# Patient Record
Sex: Female | Born: 1970 | Race: White | Hispanic: No | Marital: Married | Smoking: Never smoker
Health system: Southern US, Community
[De-identification: ages and names within clinical notes are randomized; demographics above are authoritative.]

## PROBLEM LIST (undated history)

## (undated) DIAGNOSIS — I509 Heart failure, unspecified: Secondary | ICD-10-CM

## (undated) DIAGNOSIS — R7303 Prediabetes: Secondary | ICD-10-CM

## (undated) DIAGNOSIS — E7800 Pure hypercholesterolemia, unspecified: Secondary | ICD-10-CM

## (undated) DIAGNOSIS — J302 Other seasonal allergic rhinitis: Secondary | ICD-10-CM

## (undated) DIAGNOSIS — G43909 Migraine, unspecified, not intractable, without status migrainosus: Secondary | ICD-10-CM

## (undated) DIAGNOSIS — I420 Dilated cardiomyopathy: Secondary | ICD-10-CM

## (undated) DIAGNOSIS — R011 Cardiac murmur, unspecified: Secondary | ICD-10-CM

## (undated) DIAGNOSIS — R0600 Dyspnea, unspecified: Secondary | ICD-10-CM

## (undated) HISTORY — DX: Other seasonal allergic rhinitis: J30.2

## (undated) HISTORY — DX: Migraine, unspecified, not intractable, without status migrainosus: G43.909

## (undated) HISTORY — PX: CARDIAC CATHETERIZATION: SHX172

---

## 1997-04-04 HISTORY — PX: DILATION AND CURETTAGE OF UTERUS: SHX78

## 2004-02-24 ENCOUNTER — Ambulatory Visit: Payer: Self-pay | Admitting: Obstetrics and Gynecology

## 2005-03-22 ENCOUNTER — Observation Stay: Payer: Self-pay

## 2005-06-10 ENCOUNTER — Inpatient Hospital Stay: Payer: Self-pay

## 2008-11-19 ENCOUNTER — Ambulatory Visit: Payer: Self-pay

## 2010-03-03 ENCOUNTER — Ambulatory Visit: Payer: Self-pay

## 2010-03-10 ENCOUNTER — Ambulatory Visit: Payer: Self-pay

## 2011-06-21 ENCOUNTER — Ambulatory Visit: Payer: Self-pay

## 2012-07-09 ENCOUNTER — Ambulatory Visit: Payer: Self-pay | Admitting: Obstetrics and Gynecology

## 2013-09-20 ENCOUNTER — Other Ambulatory Visit: Payer: Self-pay | Admitting: Obstetrics and Gynecology

## 2013-09-20 DIAGNOSIS — Z1231 Encounter for screening mammogram for malignant neoplasm of breast: Secondary | ICD-10-CM

## 2013-10-09 ENCOUNTER — Ambulatory Visit: Payer: Self-pay

## 2013-10-17 ENCOUNTER — Ambulatory Visit
Admission: RE | Admit: 2013-10-17 | Discharge: 2013-10-17 | Disposition: A | Discharge status: Home / Self Care | Payer: BC Managed Care – PPO | Source: Ambulatory Visit | Attending: Obstetrics and Gynecology | Admitting: Obstetrics and Gynecology

## 2013-10-17 ENCOUNTER — Encounter (INDEPENDENT_AMBULATORY_CARE_PROVIDER_SITE_OTHER): Payer: Self-pay

## 2013-10-17 DIAGNOSIS — Z1231 Encounter for screening mammogram for malignant neoplasm of breast: Secondary | ICD-10-CM

## 2014-07-11 LAB — HM PAP SMEAR: HM Pap smear: NEGATIVE

## 2014-12-25 ENCOUNTER — Other Ambulatory Visit: Payer: Self-pay | Admitting: Obstetrics and Gynecology

## 2014-12-25 ENCOUNTER — Telehealth: Payer: Self-pay | Admitting: Obstetrics and Gynecology

## 2014-12-25 DIAGNOSIS — Z1231 Encounter for screening mammogram for malignant neoplasm of breast: Secondary | ICD-10-CM

## 2014-12-25 NOTE — Telephone Encounter (Signed)
Patient called to ask if you still suggest that she have a 3D mammogram vs the normal mammo. She already has an appointment scheduled with [place]. You can reach her at [phone].Thanks

## 2014-12-26 NOTE — Telephone Encounter (Signed)
Yes I still recommend 3D- and they can now do those at [facility]- I believe it is a additional $50 charge

## 2015-01-02 ENCOUNTER — Ambulatory Visit
Admission: RE | Admit: 2015-01-02 | Discharge: 2015-01-02 | Disposition: A | Discharge status: Home / Self Care | Payer: BC Managed Care – PPO | Source: Ambulatory Visit | Attending: Obstetrics and Gynecology | Admitting: Obstetrics and Gynecology

## 2015-01-02 DIAGNOSIS — Z1231 Encounter for screening mammogram for malignant neoplasm of breast: Secondary | ICD-10-CM | POA: Insufficient documentation

## 2015-01-06 ENCOUNTER — Telehealth: Payer: Self-pay | Admitting: *Deleted

## 2015-01-06 NOTE — Telephone Encounter (Signed)
Left detailed message about mammo

## 2015-01-06 NOTE — Telephone Encounter (Signed)
-----   Message from Ulyses Amor, [place] sent at 01/06/2015  2:23 PM EDT ----- Please let her know I have reviewed her MMG and it is normal

## 2015-03-13 ENCOUNTER — Other Ambulatory Visit: Payer: Self-pay | Admitting: Obstetrics and Gynecology

## 2015-03-13 ENCOUNTER — Telehealth: Payer: Self-pay | Admitting: Obstetrics and Gynecology

## 2015-03-13 MED ORDER — HYDROCOD POLST-CPM POLST ER 10-8 MG/5ML PO SUER: 5.0000 mL | Freq: Two times a day (BID) | ORAL | Status: DC | PRN

## 2015-03-13 MED ORDER — CEFDINIR 300 MG PO CAPS: 300.0000 mg | ORAL_CAPSULE | Freq: Two times a day (BID) | ORAL | Status: DC

## 2015-03-13 NOTE — Telephone Encounter (Signed)
Pt called and she is sick, she has been running a fever off and on but is consistent on meds so that it want come back, has a really bad cough, and its started to get raspy and has had bronchitis in the past, she stated that you had called in meds for her in the past when she was sick, she wanted to be seen today and i told her we didn't have anything, so she wanted me to see if you could call something in for her. She uses [facility] [address] [place].

## 2015-03-13 NOTE — Telephone Encounter (Signed)
Please let her know I have printed a prescription she will need to run by and pick it up

## 2015-03-13 NOTE — Telephone Encounter (Signed)
UNABLE TO PICK UP THE RX WILL COME BY Monday AND GET THE TUSSINEX, CAN WE CALL IN THE OMNICEF

## 2015-07-24 ENCOUNTER — Ambulatory Visit (INDEPENDENT_AMBULATORY_CARE_PROVIDER_SITE_OTHER): Payer: BC Managed Care – PPO | Admitting: Obstetrics and Gynecology

## 2015-07-24 ENCOUNTER — Encounter: Payer: Self-pay | Admitting: Obstetrics and Gynecology

## 2015-07-24 DIAGNOSIS — E663 Overweight: Secondary | ICD-10-CM | POA: Insufficient documentation

## 2015-07-24 DIAGNOSIS — Z01419 Encounter for gynecological examination (general) (routine) without abnormal findings: Secondary | ICD-10-CM

## 2015-07-24 NOTE — Progress Notes (Signed)
Subjective:   Stacy Mercer is a 45 y.o. 843P0011 Caucasian female here for a routine well-woman exam.  Patient's last menstrual period was 07/16/2015.    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  Past Medical History Past Medical History  Diagnosis Date  . Seasonal allergies   . Migraines     Past Surgical History Past Surgical History  Procedure Laterality Date  . Dilation and curettage of uterus  1999    Gynecologic History G3P0011  Patient's last menstrual period was 07/16/2015. Contraception: IUD Last Pap: 2015. Results were: normal Last mammogram: 2016. Results were: normal   Obstetric History OB History  Gravida Para Term Preterm AB SAB TAB Ectopic Multiple Living  3    1  1   1     # Outcome Date GA Lbr Len/2nd Weight Sex Delivery Anes PTL Lv  3 TAB           2 Gravida      Vag-Spont     1 Gravida      Vag-Spont   Y      Current Medications Current Outpatient Prescriptions on File Prior to Visit  Medication Sig Dispense Refill  . cefdinir (OMNICEF) 300 MG capsule Take 1 capsule (300 mg total) by mouth 2 (two) times daily. (Patient not taking: Reported on 07/24/2015) 14 capsule 0  . chlorpheniramine-HYDROcodone (TUSSIONEX PENNKINETIC ER) 10-8 MG/5ML SUER Take 5 mLs by mouth every 12 (twelve) hours as needed for cough. (Patient not taking: Reported on 07/24/2015) 140 mL 0   No current facility-administered medications on file prior to visit.    Review of Systems Patient denies any headaches, blurred  vision, shortness of breath, chest pain, abdominal pain, problems with bowel movements, urination, or intercourse.  Objective:  BP 124/73 mmHg  Pulse 73  Ht 5\' 6"  (1.676 m)  Wt 185 lb 12.8 oz (84.278 kg)  BMI 30.00 kg/m2  LMP 07/16/2015 Physical Exam  General:  Well developed, well nourished, no acute distress. She is alert and oriented x3. Skin:  Warm and dry Neck:  Midline trachea, no thyromegaly or nodules Cardiovascular: Regular rate and rhythm, no murmur heard Lungs:  Effort normal, all lung fields clear to auscultation bilaterally Breasts:  No dominant palpable mass, retraction, or nipple discharge Abdomen:  Soft, non tender, no hepatosplenomegaly or masses Pelvic:  External genitalia is normal in appearance.  The vagina is normal in appearance. The cervix is bulbous, no CMT. IUS string noted. Thin prep pap is not done. Uterus is felt to be normal size, shape, and contour.  No adnexal masses or tenderness noted.  Extremities:  No swelling or varicosities noted Psych:  She has a normal mood and affect  Assessment:   Healthy well-woman exam H/O elevated cholesterol IUD check  Plan:  Labs obtained F/U 1 year for AE, or sooner if needed Mammogram scheduled  Alyne Martinson Suzan NailerN Dakotah Orrego, CNM

## 2015-07-24 NOTE — Patient Instructions (Signed)
  Place annual gynecologic exam patient instructions here.  Thank you for enrolling in MyChart. Please follow the instructions below to securely access your online medical record. MyChart allows you to send messages to your doctor, view your test results, manage appointments, and more.   How Do I Sign Up? 1. In your Internet browser, go to [employer] Address [employer] and enter [email or web address]. 2. Click on the Sign Up Now link in the Sign In box. You will see the New Member Sign Up page. 3. Enter your MyChart Access Code exactly as it appears below. You will not need to use this code after you've completed the sign-up process. If you do not sign up before the expiration date, you must request a new code.  MyChart Access Code: [email or web address] Expires: 09/22/2015  8:24 AM  4. Enter your Social Security Number ([id number]) and Date of Birth (mm/dd/yyyy) as indicated and click Submit. You will be taken to the next sign-up page. 5. Create a MyChart ID. This will be your MyChart login ID and cannot be changed, so think of one that is secure and easy to remember. 6. Create a MyChart password. You can change your password at any time. 7. Enter your Password Reset Question and Answer. This can be used at a later time if you forget your password.  8. Enter your e-mail address. You will receive e-mail notification when new information is available in MyChart. 9. Click Sign Up. You can now view your medical record.   Additional Information Remember, MyChart is NOT to be used for urgent needs. For medical emergencies, dial 911.

## 2015-07-25 LAB — LIPID PANEL
CHOLESTEROL TOTAL: 232 mg/dL — AB (ref 100–199)
Chol/HDL Ratio: 3.8 ratio units (ref 0.0–4.4)
HDL: 61 mg/dL (ref >39)
LDL Calculated: 153 mg/dL — ABNORMAL HIGH (ref 0–99)
Triglycerides: 91 mg/dL (ref 0–149)
VLDL CHOLESTEROL CAL: 18 mg/dL (ref 5–40)

## 2015-07-25 LAB — COMPREHENSIVE METABOLIC PANEL
ALBUMIN: 4.3 g/dL (ref 3.5–5.5)
ALK PHOS: 54 IU/L (ref 39–117)
ALT: 17 IU/L (ref 0–32)
AST: 13 IU/L (ref 0–40)
Albumin/Globulin Ratio: 2 (ref 1.2–2.2)
BILIRUBIN TOTAL: 0.5 mg/dL (ref 0.0–1.2)
BUN / CREAT RATIO: 21 (ref 9–23)
BUN: 12 mg/dL (ref 6–24)
CHLORIDE: 104 mmol/L (ref 96–106)
CO2: 22 mmol/L (ref 18–29)
Calcium: 9.4 mg/dL (ref 8.7–10.2)
Creatinine, Ser: 0.56 mg/dL — ABNORMAL LOW (ref 0.57–1.00)
GFR calc Af Amer: 129 mL/min/{1.73_m2} (ref >59)
GFR calc non Af Amer: 112 mL/min/{1.73_m2} (ref >59)
GLUCOSE: 101 mg/dL — AB (ref 65–99)
Globulin, Total: 2.1 g/dL (ref 1.5–4.5)
POTASSIUM: 3.9 mmol/L (ref 3.5–5.2)
Sodium: 143 mmol/L (ref 134–144)
Total Protein: 6.4 g/dL (ref 6.0–8.5)

## 2015-07-25 LAB — VITAMIN D 25 HYDROXY (VIT D DEFICIENCY, FRACTURES): Vit D, 25-Hydroxy: 35.8 ng/mL (ref 30.0–100.0)

## 2015-07-28 ENCOUNTER — Telehealth: Payer: Self-pay | Admitting: *Deleted

## 2015-07-28 NOTE — Telephone Encounter (Signed)
Mailed all info to pt 

## 2015-07-28 NOTE — Telephone Encounter (Signed)
-----   Message from Purcell NailsMelody N Shambley, [place] sent at 07/28/2015  3:31 PM EDT ----- Please let her know about labs, need to continue to work on weight loss, regular exercise and low carb, low cholesterol diet. Will recheck at next years physical

## 2016-07-29 ENCOUNTER — Encounter: Payer: BC Managed Care – PPO | Admitting: Obstetrics and Gynecology

## 2016-09-28 ENCOUNTER — Encounter: Payer: BC Managed Care – PPO | Admitting: Obstetrics and Gynecology

## 2016-12-16 ENCOUNTER — Encounter: Payer: BC Managed Care – PPO | Admitting: Obstetrics and Gynecology

## 2017-02-15 ENCOUNTER — Other Ambulatory Visit: Payer: Self-pay | Admitting: Obstetrics and Gynecology

## 2017-03-09 ENCOUNTER — Encounter: Payer: BC Managed Care – PPO | Admitting: Obstetrics and Gynecology

## 2017-05-11 ENCOUNTER — Encounter: Payer: BC Managed Care – PPO | Admitting: Obstetrics and Gynecology

## 2017-08-04 ENCOUNTER — Ambulatory Visit (INDEPENDENT_AMBULATORY_CARE_PROVIDER_SITE_OTHER): Payer: BC Managed Care – PPO | Admitting: Obstetrics and Gynecology

## 2017-08-04 ENCOUNTER — Encounter: Payer: Self-pay | Admitting: Obstetrics and Gynecology

## 2017-08-04 ENCOUNTER — Ambulatory Visit
Admission: RE | Admit: 2017-08-04 | Discharge: 2017-08-04 | Disposition: A | Discharge status: Home / Self Care | Payer: BC Managed Care – PPO | Source: Ambulatory Visit | Attending: Obstetrics and Gynecology | Admitting: Obstetrics and Gynecology

## 2017-08-04 VITALS — BP 122/75 | HR 79 | Ht 66.0 in | Wt 191.7 lb

## 2017-08-04 DIAGNOSIS — Z01419 Encounter for gynecological examination (general) (routine) without abnormal findings: Secondary | ICD-10-CM | POA: Diagnosis not present

## 2017-08-04 DIAGNOSIS — Z1231 Encounter for screening mammogram for malignant neoplasm of breast: Secondary | ICD-10-CM | POA: Insufficient documentation

## 2017-08-04 NOTE — Progress Notes (Signed)
Subjective:   Stacy Mercer is a 47 y.o. G54P0011 Caucasian female here for a routine well-woman exam.  No LMP recorded. (Menstrual status: IUD).    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  Past Medical History Past Medical History:  Diagnosis Date  . Migraines   . Seasonal allergies     Past Surgical History Past Surgical History:  Procedure Laterality Date  . DILATION AND CURETTAGE OF UTERUS  1999    Gynecologic History G3P0011  No LMP recorded. (Menstrual status: IUD). Contraception: IUD Last Pap: 2016. Results were: normal Last mammogram: 2016. Results were: normal   Obstetric History OB History  Gravida Para Term Preterm AB Living  SAB TAB Ectopic Multiple Live Births    1     1    # Outcome Date GA Lbr Len/2nd Weight Sex Delivery Anes PTL Lv  3 TAB           2 Gravida      Vag-Spont     1 Gravida      Vag-Spont   LIV    Current Medications Current Outpatient Medications on File Prior to Visit  Medication Sig Dispense Refill  . levonorgestrel (MIRENA) 20 MCG/24HR IUD 1 each by Intrauterine route once.     No current facility-administered medications on file prior to visit.     Review of Systems Patient denies any headaches, blurred vision, shortness of breath, chest pain, abdominal pain, problems with bowel movements, urination, or intercourse.  Objective:  BP 122/75   Pulse 79   Ht  (1.676 m)   Wt 191 lb 11.2 oz (87 kg)   BMI 30.94 kg/m  Physical Exam  General:  Well  developed, well nourished, no acute distress. She is alert and oriented x3. Skin:  Warm and dry Neck:  Midline trachea, no thyromegaly or nodules Cardiovascular: Regular rate and rhythm, no murmur heard Lungs:  Effort normal, all lung fields clear to auscultation bilaterally Breasts:  No dominant palpable mass, retraction, or nipple discharge Abdomen:  Soft, non tender, no hepatosplenomegaly or masses Pelvic:  External genitalia is normal in appearance.  The vagina is normal in appearance. The cervix is bulbous, no CMT.IUD string noted.  Thin prep pap is not done . Uterus is felt to be normal size, shape, and contour.  No adnexal masses or tenderness noted. Extremities:  No swelling or varicosities noted Psych:  She has a normal mood and affect  Assessment:   Healthy well-woman exam IUD check obesity  Plan:  Labs obtained-will follow up accordingly F/U 1 year for AE, or sooner if needed Mammogram past due-ordered  Bryndan Bilyk Suzan Nailer, CNM

## 2017-08-05 LAB — COMPREHENSIVE METABOLIC PANEL
A/G RATIO: 2.1 (ref 1.2–2.2)
ALK PHOS: 64 IU/L (ref 39–117)
ALT: 16 IU/L (ref 0–32)
AST: 12 IU/L (ref 0–40)
Albumin: 4.4 g/dL (ref 3.5–5.5)
BILIRUBIN TOTAL: 0.5 mg/dL (ref 0.0–1.2)
BUN / CREAT RATIO: 16 (ref 9–23)
BUN: 10 mg/dL (ref 6–24)
CO2: 24 mmol/L (ref 20–29)
CREATININE: 0.63 mg/dL (ref 0.57–1.00)
Calcium: 9.1 mg/dL (ref 8.7–10.2)
Chloride: 105 mmol/L (ref 96–106)
GFR calc Af Amer: 123 mL/min/{1.73_m2} (ref >59)
GFR calc non Af Amer: 106 mL/min/{1.73_m2} (ref >59)
GLOBULIN, TOTAL: 2.1 g/dL (ref 1.5–4.5)
Glucose: 101 mg/dL — ABNORMAL HIGH (ref 65–99)
POTASSIUM: 4.1 mmol/L (ref 3.5–5.2)
SODIUM: 142 mmol/L (ref 134–144)
Total Protein: 6.5 g/dL (ref 6.0–8.5)

## 2017-08-05 LAB — LIPID PANEL
CHOLESTEROL TOTAL: 258 mg/dL — AB (ref 100–199)
Chol/HDL Ratio: 4.9 ratio — ABNORMAL HIGH (ref 0.0–4.4)
HDL: 53 mg/dL (ref >39)
LDL Calculated: 182 mg/dL — ABNORMAL HIGH (ref 0–99)
TRIGLYCERIDES: 114 mg/dL (ref 0–149)
VLDL CHOLESTEROL CAL: 23 mg/dL (ref 5–40)

## 2017-08-05 LAB — HEMOGLOBIN A1C
ESTIMATED AVERAGE GLUCOSE: 123 mg/dL
HEMOGLOBIN A1C: 5.9 % — AB (ref 4.8–5.6)

## 2017-08-08 ENCOUNTER — Other Ambulatory Visit: Payer: Self-pay | Admitting: Obstetrics and Gynecology

## 2017-08-08 ENCOUNTER — Telehealth: Payer: Self-pay | Admitting: *Deleted

## 2017-08-08 DIAGNOSIS — R7303 Prediabetes: Secondary | ICD-10-CM

## 2017-08-08 DIAGNOSIS — E7800 Pure hypercholesterolemia, unspecified: Secondary | ICD-10-CM | POA: Insufficient documentation

## 2017-08-08 NOTE — Telephone Encounter (Signed)
Mailed pt all info on labs 

## 2017-08-08 NOTE — Telephone Encounter (Signed)
-----   Message from Purcell Nails, [place] sent at 08/08/2017 10:36 AM EDT ----- Please notify patient of lab results, her cholesterol is up more and she is now pre-diabetic. Please mail info on both, and tell her I want to recheck labs in 6 months, fasting. To increase exercise and work on weight loss.

## 2017-11-28 ENCOUNTER — Telehealth: Payer: Self-pay | Admitting: Obstetrics and Gynecology

## 2017-11-28 NOTE — Telephone Encounter (Signed)
Called pt she is going to come in and discuss IUD removal

## 2017-11-28 NOTE — Telephone Encounter (Signed)
The patient is stating she has had her Mirena for several years and is now experiencing regular periods.  She was told she would need to contact her provider once this happened to discuss the next steps/options, and is asking if her nurse/provider can give her a call first to discuss this prior to making an appointment.  Her best call back number is [phone], please advise, thanks.

## 2017-11-30 ENCOUNTER — Encounter: Payer: Self-pay | Admitting: Obstetrics and Gynecology

## 2017-11-30 ENCOUNTER — Ambulatory Visit: Payer: BC Managed Care – PPO | Admitting: Obstetrics and Gynecology

## 2017-11-30 VITALS — BP 131/81 | HR 77 | Ht 66.0 in | Wt 183.5 lb

## 2017-11-30 DIAGNOSIS — Z975 Presence of (intrauterine) contraceptive device: Secondary | ICD-10-CM

## 2017-11-30 DIAGNOSIS — N921 Excessive and frequent menstruation with irregular cycle: Secondary | ICD-10-CM | POA: Diagnosis not present

## 2017-11-30 NOTE — Progress Notes (Signed)
  Subjective:     Patient ID: Stacy Mercer, female   DOB: 06-Nov-1970, 47 y.o.   MRN: [id number]  HPI IUD has been in for 6 years and menses have returned since June, occurring every 24d.  Reports onset menarche age 47.   Review of Systems  Constitutional: Negative.   HENT: Negative.   Eyes: Negative.   Respiratory: Negative.   Cardiovascular: Negative.   Gastrointestinal: Negative.   Endocrine: Negative.   Genitourinary: Positive for menstrual problem.       Objective:   Physical Exam A&Ox4 Well groomed female in no distress Vitals:   11/30/17 0850  Weight: 183 lb 8 oz (83.2 kg)  Height: 5\' 6"  (1.676 m)   Pelvic deferred Labs obtained.     Assessment:     BTB with IUD    Plan:     Labs obtained to try and determine phase of menopause, if close will keep IUD one more year, if not will put a new one in.   Melody [place], CNM

## 2017-12-01 LAB — FSH/LH
FSH: 15.1 m[IU]/mL
LH: 8.8 m[IU]/mL

## 2017-12-01 LAB — THYROID PANEL WITH TSH
Free Thyroxine Index: 1.8 (ref 1.2–4.9)
T3 Uptake Ratio: 23 % — ABNORMAL LOW (ref 24–39)
T4, Total: 7.7 ug/dL (ref 4.5–12.0)
TSH: 2.19 u[IU]/mL (ref 0.450–4.500)

## 2017-12-01 LAB — PROGESTERONE: Progesterone: 1.9 ng/mL

## 2017-12-01 LAB — ESTRADIOL: ESTRADIOL: 20.9 pg/mL

## 2017-12-26 ENCOUNTER — Telehealth: Payer: Self-pay | Admitting: Obstetrics and Gynecology

## 2017-12-26 NOTE — Telephone Encounter (Signed)
The patient called and state that she need to speak with Amy or Melody in regards to her not having earl;y menopause and needing to come in for IUD removal and reinsertion. Please advise.

## 2017-12-27 NOTE — Telephone Encounter (Signed)
Hey Stacy Mercer could you please make her appt for this, thanks

## 2018-01-04 ENCOUNTER — Other Ambulatory Visit: Payer: Self-pay | Admitting: Obstetrics and Gynecology

## 2018-01-04 ENCOUNTER — Encounter: Payer: Self-pay | Admitting: Obstetrics and Gynecology

## 2018-01-04 ENCOUNTER — Encounter: Payer: BC Managed Care – PPO | Admitting: Obstetrics and Gynecology

## 2018-01-04 ENCOUNTER — Telehealth: Payer: Self-pay | Admitting: Obstetrics and Gynecology

## 2018-01-04 ENCOUNTER — Ambulatory Visit (INDEPENDENT_AMBULATORY_CARE_PROVIDER_SITE_OTHER): Payer: BC Managed Care – PPO | Admitting: Obstetrics and Gynecology

## 2018-01-04 VITALS — BP 128/88 | HR 97 | Ht 66.0 in | Wt 189.2 lb

## 2018-01-04 DIAGNOSIS — Z30433 Encounter for removal and reinsertion of intrauterine contraceptive device: Secondary | ICD-10-CM | POA: Diagnosis not present

## 2018-01-04 DIAGNOSIS — N841 Polyp of cervix uteri: Secondary | ICD-10-CM

## 2018-01-04 NOTE — Telephone Encounter (Signed)
Spoke with pt we discussed her procedure, she voiced understanding

## 2018-01-04 NOTE — Progress Notes (Signed)
Stacy Mercer is a 47 y.o. year old G4P0011 Caucasian female who presents for removal and replacement of a Mirena IUD. She was given informed consent for removal and reinsertion of her Mirena. Her Mirena was placed 2013, No LMP recorded. (Menstrual status: IUD)., and her pregnancy test today was negative.   The risks and benefits of the method and placement have been thouroughly reviewed with the patient and all questions were answered.  Specifically the patient is aware of failure rate of 04/998, expulsion of the IUD and of possible perforation.  The patient is aware of irregular bleeding due to the method and understands the incidence of irregular bleeding diminishes with time.  Signed copy of informed consent in chart.   No LMP recorded. (Menstrual status: IUD). BP 128/88   Pulse 97   Ht 5\' 6"  (1.676 m)   Wt 189 lb 3.2 oz (85.8 kg)   BMI 30.54 kg/m  No results found for this or any previous visit (from the past 24 hour(s)).   Appropriate time out taken. A graves speculum was placed in the vagina.  The cervix was visualized, prepped using Betadine.4mm polyp noted extended from endocervical os and was grasped easily with sponge clamp and removed. and  The strings were visible. They were grasped and the Mirena was easily removed. The cervix was then grasped with a single-tooth tenaculum. The uterus was found to be neutral and it sounded to 9 cm.  Mirena IUD placed per manufacturer's recommendations without complications. The strings were trimmed to 3 cm.  The patient tolerated the procedure well.   The patient was given post procedure instructions, including signs and symptoms of infection and to check for the strings after each menses or each month, and refraining from intercourse or anything in the vagina for 3 days. Cervical polyp sent for pathology and will notify via MyChart of results.  She was given a Mirena care card with date Mirena placed, and date Mirena to be removed.    Nicolae Vasek Suzan Nailer, CNM

## 2018-01-04 NOTE — Telephone Encounter (Signed)
The patient called and stated that she would like a call back from Amy in regards to her having a tablespoon amount of blood after today's visit. Please advise.

## 2018-01-10 LAB — PATHOLOGY

## 2018-07-06 ENCOUNTER — Telehealth: Payer: Self-pay

## 2018-07-06 NOTE — Telephone Encounter (Signed)
Called pt to sign up for mychart. Pt requested the link to be sent to her email address. Address was reviewed and correct. Link sent to pt.

## 2018-08-09 ENCOUNTER — Encounter: Payer: BC Managed Care – PPO | Admitting: Obstetrics and Gynecology

## 2018-10-19 ENCOUNTER — Ambulatory Visit (INDEPENDENT_AMBULATORY_CARE_PROVIDER_SITE_OTHER): Payer: BC Managed Care – PPO | Admitting: Obstetrics and Gynecology

## 2018-10-19 ENCOUNTER — Other Ambulatory Visit: Payer: Self-pay

## 2018-10-19 ENCOUNTER — Encounter: Payer: Self-pay | Admitting: Obstetrics and Gynecology

## 2018-10-19 ENCOUNTER — Other Ambulatory Visit (HOSPITAL_COMMUNITY)
Admission: RE | Admit: 2018-10-19 | Discharge: 2018-10-19 | Disposition: A | Discharge status: Home / Self Care | Payer: BC Managed Care – PPO | Source: Ambulatory Visit | Attending: Obstetrics and Gynecology | Admitting: Obstetrics and Gynecology

## 2018-10-19 VITALS — BP 141/76 | HR 76 | Ht 66.0 in | Wt 191.8 lb

## 2018-10-19 DIAGNOSIS — Z01419 Encounter for gynecological examination (general) (routine) without abnormal findings: Secondary | ICD-10-CM | POA: Insufficient documentation

## 2018-10-19 DIAGNOSIS — E7800 Pure hypercholesterolemia, unspecified: Secondary | ICD-10-CM | POA: Diagnosis not present

## 2018-10-19 DIAGNOSIS — R7303 Prediabetes: Secondary | ICD-10-CM

## 2018-10-19 NOTE — Progress Notes (Signed)
Subjective:   Stacy Mercer is a 48 y.o. G67P0011 Caucasian female here for a routine well-woman exam.  No LMP recorded. (Menstrual status: IUD).    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  Past Medical History Past Medical History:  Diagnosis Date  . Migraines   . Seasonal allergies     Past Surgical History Past Surgical History:  Procedure Laterality Date  . DILATION AND CURETTAGE OF UTERUS  1999    Gynecologic History G3P0011  No LMP recorded. (Menstrual status: IUD). Contraception: IUD Last Pap: 2016. Results were: normal Last mammogram: 08/2017. Results were: normal   Obstetric History OB History  Gravida Para Term Preterm AB Living  3       1 1   SAB TAB Ectopic Multiple Live Births    1     1    # Outcome Date GA Lbr Len/2nd Weight Sex Delivery Anes PTL Lv  3 TAB           2 Gravida      Vag-Spont     1 Gravida      Vag-Spont   LIV    Current Medications Current Outpatient Medications on File Prior to Visit  Medication Sig Dispense Refill  . levonorgestrel (MIRENA) 20 MCG/24HR IUD 1 each by Intrauterine route once.     No current facility-administered medications on file prior to visit.     Review of Systems Patient denies any headaches, blurred vision, shortness of breath, chest pain, abdominal pain, problems with bowel movements, urination, or intercourse.  Objective:  BP (!) 141/76   Pulse 76   Ht 5\' 6"  (1.676 m)   Wt 191 lb 12.8 oz (87 kg)   BMI 30.96 kg/m  Physical Exam  General:  Well developed,  well nourished, no acute distress. She is alert and oriented x3. Skin:  Warm and dry Neck:  Midline trachea, no thyromegaly or nodules Cardiovascular: Regular rate and rhythm, no murmur heard Lungs:  Effort normal, all lung fields clear to auscultation bilaterally Breasts:  No dominant palpable mass, retraction, or nipple discharge Abdomen:  Soft, non tender, no hepatosplenomegaly or masses Pelvic:  External genitalia is normal in appearance.  The vagina is normal in appearance. The cervix is bulbous, no CMT.  Thin prep pap is done with HR HPV cotesting. Uterus is felt to be normal size, shape, and contour.  No adnexal masses or tenderness noted.IUD string noted Extremities:  No swelling or varicosities noted Psych:  She has a normal mood and affect  Assessment:   Healthy well-woman exam IUD check Pre-diabetes Elevated cholesterol BMI 30  Plan:  Labs obtained-will follow up accordingly F/U 1 year for AE, or sooner if needed Mammogram ordered Colonoscopy declined will do Cologard screening though, order placed.  Naria Abbey Rockney Ghee, CNM

## 2018-10-20 LAB — LIPID PANEL
Chol/HDL Ratio: 4.6 ratio — ABNORMAL HIGH (ref 0.0–4.4)
Cholesterol, Total: 249 mg/dL — ABNORMAL HIGH (ref 100–199)
HDL: 54 mg/dL (ref >39)
LDL Calculated: 172 mg/dL — ABNORMAL HIGH (ref 0–99)
Triglycerides: 114 mg/dL (ref 0–149)
VLDL Cholesterol Cal: 23 mg/dL (ref 5–40)

## 2018-10-20 LAB — COMPREHENSIVE METABOLIC PANEL
ALT: 16 IU/L (ref 0–32)
AST: 15 IU/L (ref 0–40)
Albumin/Globulin Ratio: 2.3 — ABNORMAL HIGH (ref 1.2–2.2)
Albumin: 4.4 g/dL (ref 3.8–4.8)
Alkaline Phosphatase: 61 IU/L (ref 39–117)
BUN/Creatinine Ratio: 19 (ref 9–23)
BUN: 11 mg/dL (ref 6–24)
Bilirubin Total: 0.6 mg/dL (ref 0.0–1.2)
CO2: 21 mmol/L (ref 20–29)
Calcium: 8.9 mg/dL (ref 8.7–10.2)
Chloride: 103 mmol/L (ref 96–106)
Creatinine, Ser: 0.59 mg/dL (ref 0.57–1.00)
GFR calc Af Amer: 124 mL/min/{1.73_m2} (ref >59)
GFR calc non Af Amer: 107 mL/min/{1.73_m2} (ref >59)
Globulin, Total: 1.9 g/dL (ref 1.5–4.5)
Glucose: 103 mg/dL — ABNORMAL HIGH (ref 65–99)
Potassium: 3.9 mmol/L (ref 3.5–5.2)
Sodium: 139 mmol/L (ref 134–144)
Total Protein: 6.3 g/dL (ref 6.0–8.5)

## 2018-10-20 LAB — HEMOGLOBIN A1C
Est. average glucose Bld gHb Est-mCnc: 120 mg/dL
Hgb A1c MFr Bld: 5.8 % — ABNORMAL HIGH (ref 4.8–5.6)

## 2018-10-20 LAB — FSH/LH
FSH: 5.3 m[IU]/mL
LH: 6.2 m[IU]/mL

## 2018-10-20 LAB — TSH: TSH: 3.13 u[IU]/mL (ref 0.450–4.500)

## 2018-10-24 LAB — CYTOLOGY - PAP
Diagnosis: NEGATIVE
HPV: NOT DETECTED

## 2019-01-15 ENCOUNTER — Telehealth: Payer: Self-pay | Admitting: Obstetrics and Gynecology

## 2019-01-15 NOTE — Telephone Encounter (Signed)
LM for patient to return call.

## 2019-01-15 NOTE — Telephone Encounter (Signed)
The patient called and stated that she was seen back in June and the patient stated that her provider informed her that a colonoscopy/stool test kit would be mailed to her. Pt stated that she has not received it yet and is wanting to know what time frame should she expect to receive that. Please advise.

## 2019-01-21 NOTE — Telephone Encounter (Signed)
LM for patient to return call.

## 2019-02-12 ENCOUNTER — Telehealth: Payer: Self-pay | Admitting: Obstetrics and Gynecology

## 2019-02-12 NOTE — Telephone Encounter (Signed)
Cologuard order requisition form faxed to [employer] and confirmation received.

## 2019-02-12 NOTE — Telephone Encounter (Signed)
Pt called and stated that a stool kit was to be sent to her and that she never got it in the mail. The patient is requesting a call back from a nurse. Please advise.

## 2019-02-12 NOTE — Telephone Encounter (Signed)
Attempted to contact patient, no answer.  LMTRC. 

## 2019-02-13 NOTE — Telephone Encounter (Signed)
Called and spoke with patient.  Aware that order request was faxed to Colog[facility].  Advised if she does not receive within 2 weeks to call back, patient verbalized understanding.

## 2019-02-14 ENCOUNTER — Ambulatory Visit
Admission: RE | Admit: 2019-02-14 | Discharge: 2019-02-14 | Disposition: A | Discharge status: Home / Self Care | Payer: BC Managed Care – PPO | Source: Ambulatory Visit | Attending: Obstetrics and Gynecology | Admitting: Obstetrics and Gynecology

## 2019-02-14 ENCOUNTER — Encounter (INDEPENDENT_AMBULATORY_CARE_PROVIDER_SITE_OTHER): Payer: Self-pay

## 2019-02-14 ENCOUNTER — Other Ambulatory Visit: Payer: Self-pay

## 2019-02-14 DIAGNOSIS — Z01419 Encounter for gynecological examination (general) (routine) without abnormal findings: Secondary | ICD-10-CM | POA: Diagnosis present

## 2019-02-14 DIAGNOSIS — Z1231 Encounter for screening mammogram for malignant neoplasm of breast: Secondary | ICD-10-CM | POA: Diagnosis not present

## 2019-05-07 LAB — COLOGUARD: COLOGUARD: NEGATIVE

## 2019-05-07 LAB — EXTERNAL GENERIC LAB PROCEDURE: COLOGUARD: NEGATIVE

## 2019-10-22 ENCOUNTER — Encounter: Payer: BC Managed Care – PPO | Admitting: Obstetrics and Gynecology

## 2020-06-10 ENCOUNTER — Telehealth: Payer: Self-pay | Admitting: Obstetrics and Gynecology

## 2020-06-10 NOTE — Telephone Encounter (Signed)
Patient called about results form colon test (by mail) saw melody on 10-2019; states that she never heard anything form office about results. Pt is debating scheduling physical with [place] or establishing care elsewhere.

## 2020-06-10 NOTE — Telephone Encounter (Signed)
LMTRC

## 2020-06-12 NOTE — Telephone Encounter (Signed)
Pt aware cologard neg.   Encouraged to make AE appt. Pt wants to think about it.

## 2021-04-28 ENCOUNTER — Other Ambulatory Visit: Payer: Self-pay | Admitting: Certified Nurse Midwife

## 2021-04-28 DIAGNOSIS — Z1231 Encounter for screening mammogram for malignant neoplasm of breast: Secondary | ICD-10-CM

## 2021-06-15 ENCOUNTER — Ambulatory Visit
Admission: RE | Admit: 2021-06-15 | Discharge: 2021-06-15 | Disposition: A | Discharge status: Home / Self Care | Payer: BC Managed Care – PPO | Source: Ambulatory Visit | Attending: Certified Nurse Midwife | Admitting: Certified Nurse Midwife

## 2021-06-15 ENCOUNTER — Other Ambulatory Visit: Payer: Self-pay

## 2021-06-15 DIAGNOSIS — Z1231 Encounter for screening mammogram for malignant neoplasm of breast: Secondary | ICD-10-CM | POA: Diagnosis present

## 2022-04-21 DIAGNOSIS — R030 Elevated blood-pressure reading, without diagnosis of hypertension: Secondary | ICD-10-CM | POA: Insufficient documentation

## 2022-04-30 IMAGING — MG MM DIGITAL SCREENING BILAT W/ TOMO AND CAD
8 series · 8 of 24 positions shown · non-contrast
Comparison: Previous exam(s).

CLINICAL DATA: Screening.

EXAM:
DIGITAL SCREENING BILATERAL MAMMOGRAM WITH TOMOSYNTHESIS AND CAD
TECHNIQUE: Bilateral screening digital craniocaudal and mediolateral oblique
mammograms were obtained. Bilateral screening digital breast
tomosynthesis was performed. The images were evaluated with
computer-aided detection.

[L CC synth-2D]
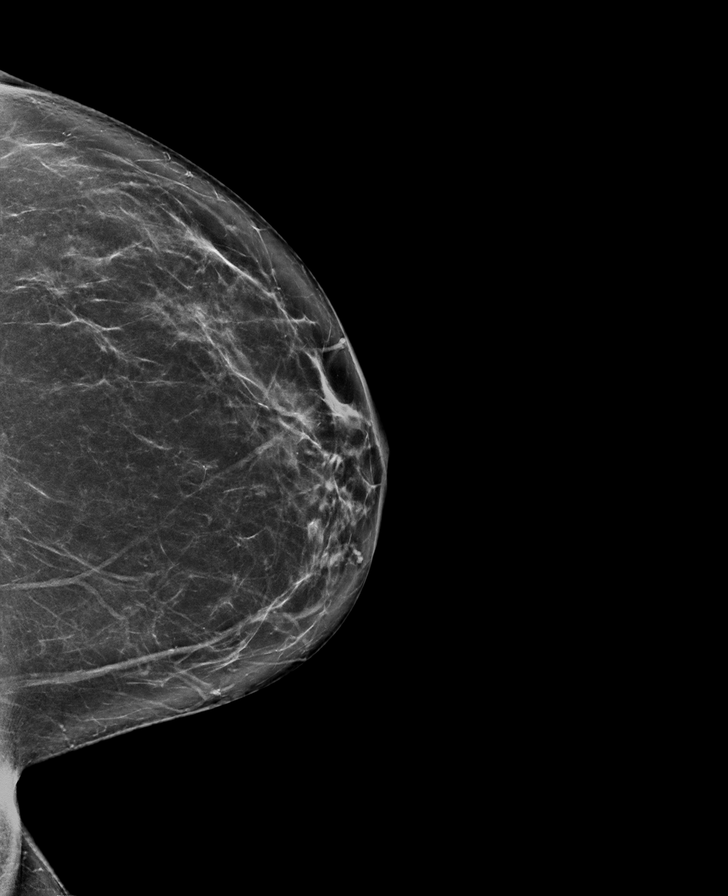

[R MLO synth-2D]
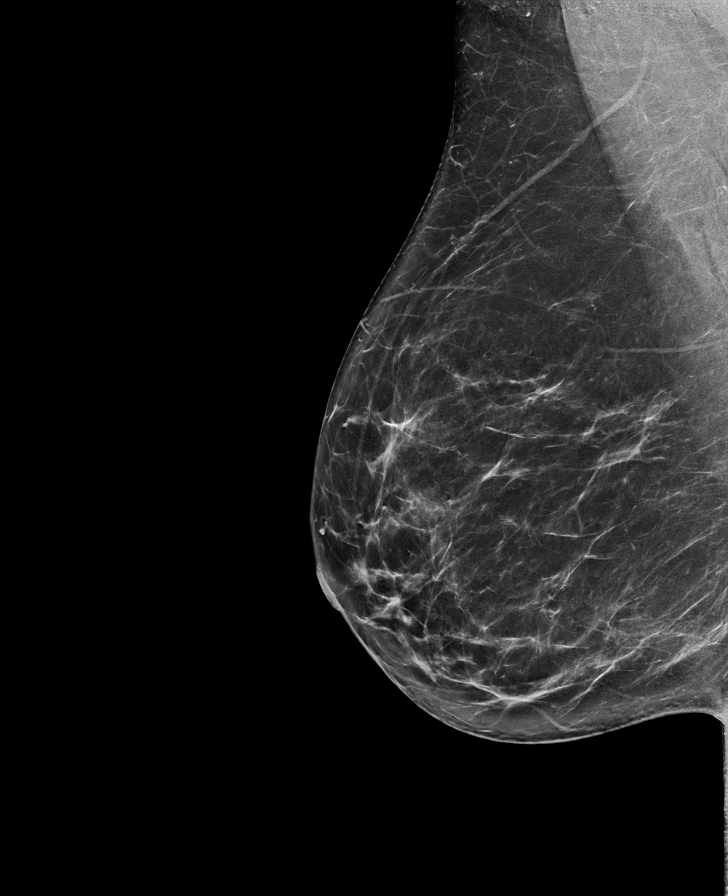

[R CC synth-2D]
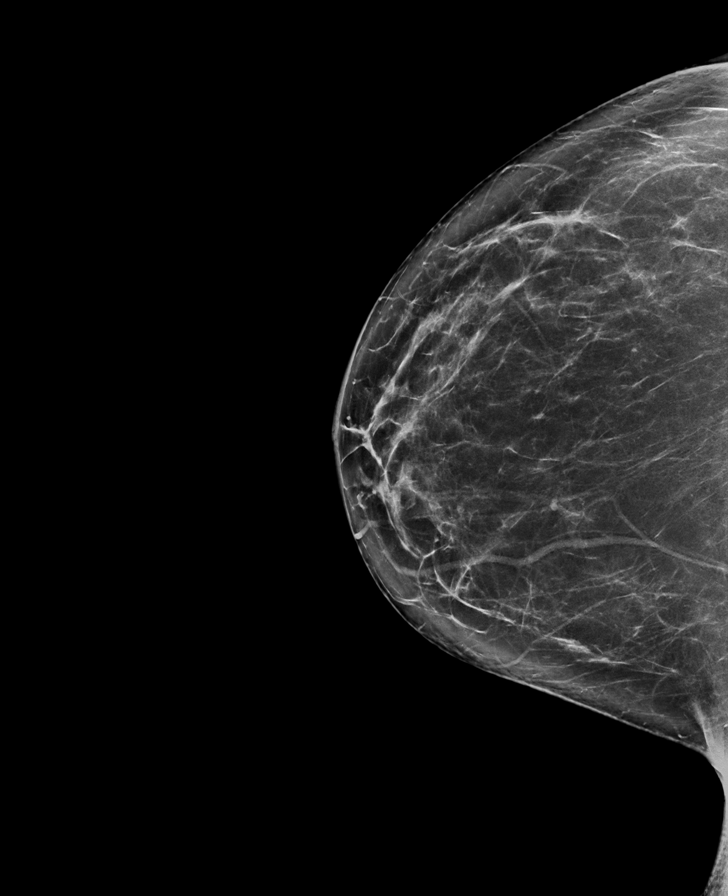

[L MLO synth-2D]
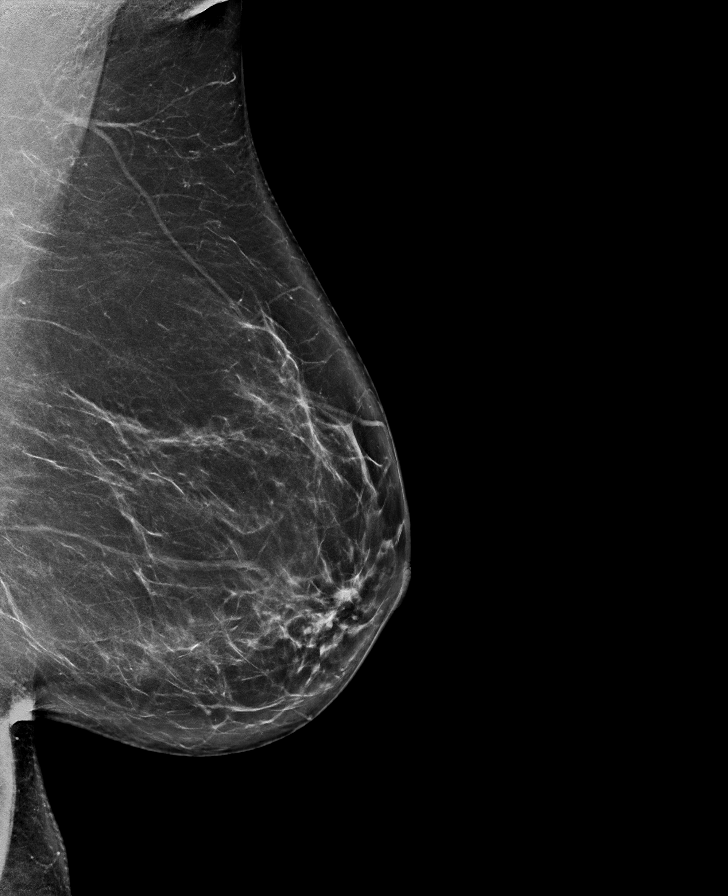

[R MLO tomo · tomo slice 43/85.0]
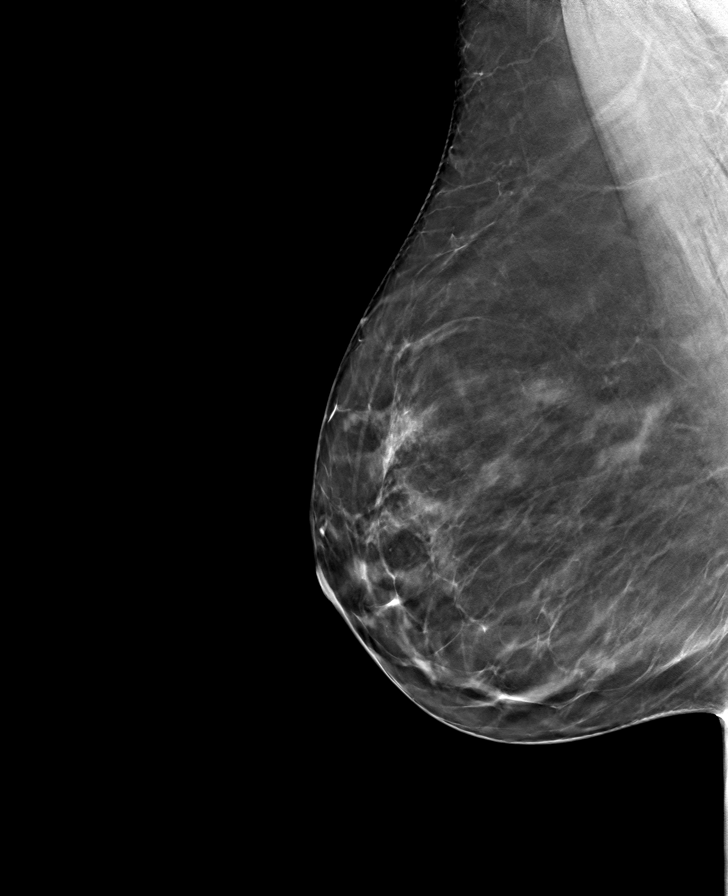

[L MLO tomo · tomo slice 45/89.0]
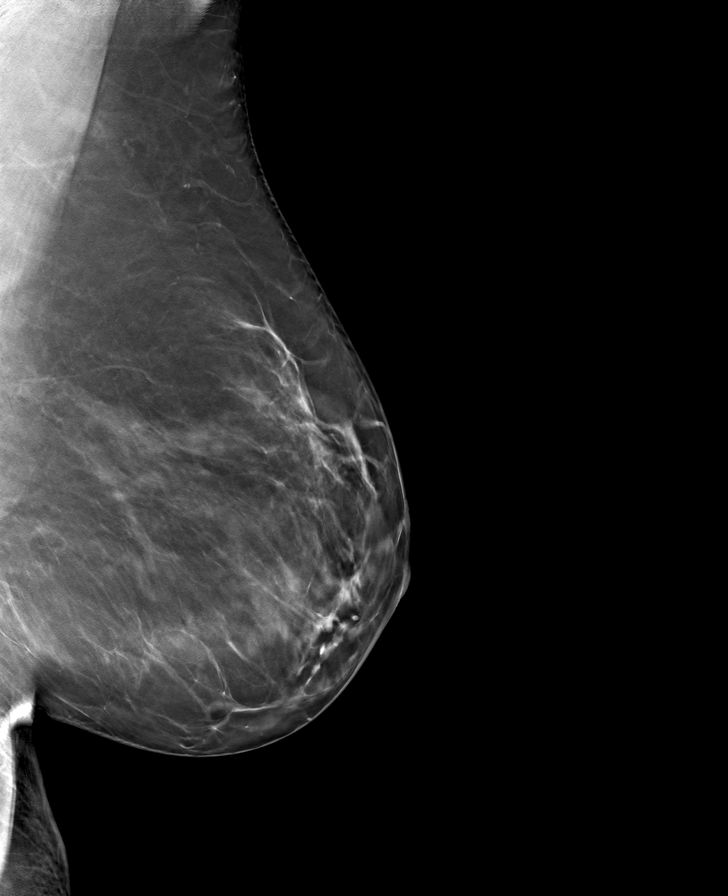

[R CC tomo · tomo slice 42/83.0]
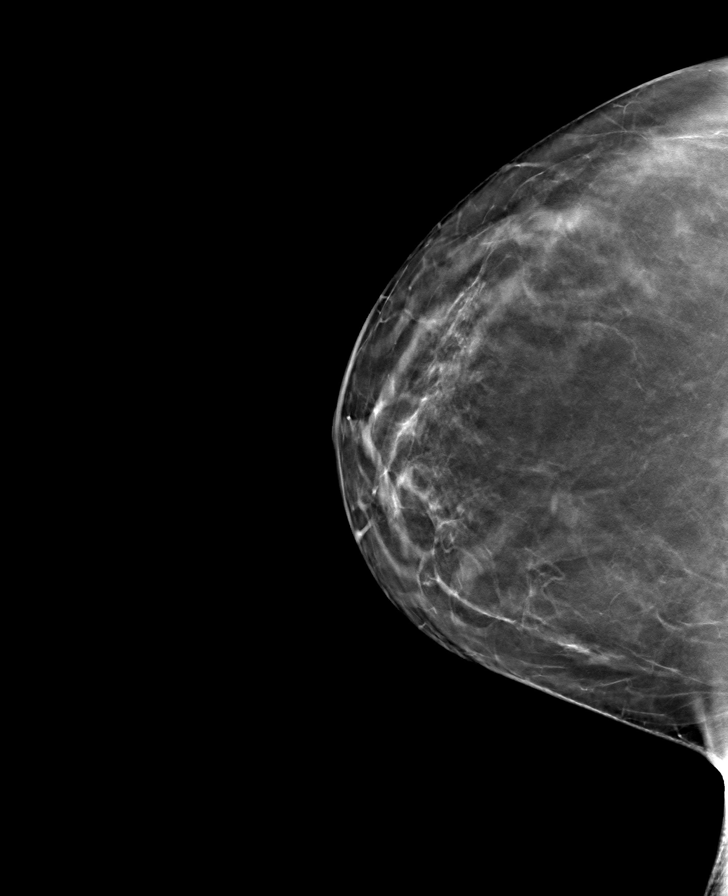

[L CC tomo · tomo slice 41/81.0]
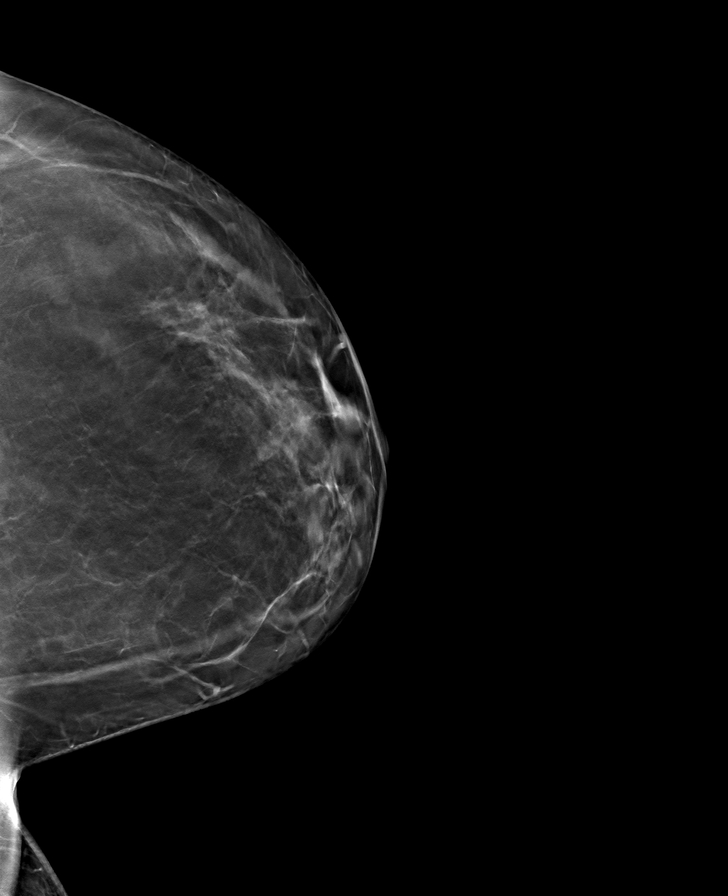

[8 of 24 positions shown; findings below may reference images not displayed]

ACR Breast Density Category c: The breast tissue is heterogeneously
dense, which may obscure small masses.
FINDINGS: There are no findings suspicious for malignancy.
IMPRESSION: No mammographic evidence of malignancy. A result letter of this
screening mammogram will be mailed directly to the patient.

RECOMMENDATION:
Screening mammogram in one year. (Code:Q3-W-BC3)

BI-RADS CATEGORY  1: Negative.

## 2022-05-03 DIAGNOSIS — I420 Dilated cardiomyopathy: Secondary | ICD-10-CM | POA: Insufficient documentation

## 2022-06-06 ENCOUNTER — Other Ambulatory Visit: Payer: Self-pay | Admitting: Obstetrics and Gynecology

## 2022-06-06 DIAGNOSIS — Z1231 Encounter for screening mammogram for malignant neoplasm of breast: Secondary | ICD-10-CM

## 2022-06-07 ENCOUNTER — Ambulatory Visit
Admission: RE | Admit: 2022-06-07 | Discharge: 2022-06-07 | Disposition: A | Discharge status: Home / Self Care | Payer: BC Managed Care – PPO | Attending: Cardiology | Admitting: Cardiology

## 2022-06-07 ENCOUNTER — Encounter: Payer: Self-pay | Admitting: Cardiology

## 2022-06-07 ENCOUNTER — Encounter
Admission: RE | Disposition: A | Discharge status: Home / Self Care | Payer: Self-pay | Source: Home / Self Care | Attending: Cardiology

## 2022-06-07 DIAGNOSIS — I351 Nonrheumatic aortic (valve) insufficiency: Secondary | ICD-10-CM | POA: Insufficient documentation

## 2022-06-07 DIAGNOSIS — R079 Chest pain, unspecified: Secondary | ICD-10-CM | POA: Diagnosis present

## 2022-06-07 DIAGNOSIS — Z01818 Encounter for other preprocedural examination: Secondary | ICD-10-CM

## 2022-06-07 HISTORY — PX: LEFT HEART CATH AND CORONARY ANGIOGRAPHY: CATH118249

## 2022-06-07 SURGERY — LEFT HEART CATH AND CORONARY ANGIOGRAPHY
Anesthesia: Moderate Sedation | Laterality: Left

## 2022-06-07 MED ORDER — ASPIRIN 81 MG PO CHEW
CHEWABLE_TABLET | ORAL | Status: AC
  Filled 2022-06-07: qty 1

## 2022-06-07 MED ORDER — SODIUM CHLORIDE 0.9% FLUSH: 3.0000 mL | INTRAVENOUS | Status: DC | PRN

## 2022-06-07 MED ORDER — LABETALOL HCL 5 MG/ML IV SOLN: 10.0000 mg | INTRAVENOUS | Status: DC | PRN

## 2022-06-07 MED ORDER — LIDOCAINE HCL (PF) 1 % IJ SOLN
INTRAMUSCULAR | Status: DC | PRN
  Administered 2022-06-07: 2 mL

## 2022-06-07 MED ORDER — SODIUM CHLORIDE 0.9% FLUSH: 3.0000 mL | Freq: Two times a day (BID) | INTRAVENOUS | Status: DC

## 2022-06-07 MED ORDER — ACETAMINOPHEN 325 MG PO TABS: 650.0000 mg | ORAL_TABLET | ORAL | Status: DC | PRN

## 2022-06-07 MED ORDER — FENTANYL CITRATE (PF) 100 MCG/2ML IJ SOLN
INTRAMUSCULAR | Status: AC
  Filled 2022-06-07: qty 2

## 2022-06-07 MED ORDER — VERAPAMIL HCL 2.5 MG/ML IV SOLN
INTRAVENOUS | Status: AC
  Filled 2022-06-07: qty 2

## 2022-06-07 MED ORDER — SODIUM CHLORIDE 0.9 % IV SOLN: 250.0000 mL | INTRAVENOUS | Status: DC | PRN

## 2022-06-07 MED ORDER — HEPARIN SODIUM (PORCINE) 1000 UNIT/ML IJ SOLN
INTRAMUSCULAR | Status: AC
  Filled 2022-06-07: qty 10

## 2022-06-07 MED ORDER — HYDRALAZINE HCL 20 MG/ML IJ SOLN: 10.0000 mg | INTRAMUSCULAR | Status: DC | PRN

## 2022-06-07 MED ORDER — ASPIRIN 81 MG PO CHEW
81.0000 mg | CHEWABLE_TABLET | ORAL | Status: AC
  Administered 2022-06-07: 81 mg via ORAL

## 2022-06-07 MED ORDER — SODIUM CHLORIDE 0.9 % WEIGHT BASED INFUSION
3.0000 mL/kg/h | INTRAVENOUS | Status: AC
  Administered 2022-06-07: 3 mL/kg/h via INTRAVENOUS

## 2022-06-07 MED ORDER — SODIUM CHLORIDE 0.9 % WEIGHT BASED INFUSION: 1.0000 mL/kg/h | INTRAVENOUS | Status: DC

## 2022-06-07 MED ORDER — HEPARIN (PORCINE) IN NACL 1000-0.9 UT/500ML-% IV SOLN
INTRAVENOUS | Status: AC
  Filled 2022-06-07: qty 1000

## 2022-06-07 MED ORDER — HEPARIN (PORCINE) IN NACL 2000-0.9 UNIT/L-% IV SOLN
INTRAVENOUS | Status: DC | PRN
  Administered 2022-06-07: 1000 mL

## 2022-06-07 MED ORDER — ONDANSETRON HCL 4 MG/2ML IJ SOLN: 4.0000 mg | Freq: Four times a day (QID) | INTRAMUSCULAR | Status: DC | PRN

## 2022-06-07 MED ORDER — FENTANYL CITRATE (PF) 100 MCG/2ML IJ SOLN
INTRAMUSCULAR | Status: DC | PRN
  Administered 2022-06-07: 25 ug via INTRAVENOUS

## 2022-06-07 MED ORDER — IOHEXOL 300 MG/ML  SOLN
INTRAMUSCULAR | Status: DC | PRN
  Administered 2022-06-07: 95 mL

## 2022-06-07 MED ORDER — HEPARIN SODIUM (PORCINE) 1000 UNIT/ML IJ SOLN
INTRAMUSCULAR | Status: DC | PRN
  Administered 2022-06-07: 4000 [IU] via INTRAVENOUS

## 2022-06-07 MED ORDER — VERAPAMIL HCL 2.5 MG/ML IV SOLN
INTRAVENOUS | Status: DC | PRN
  Administered 2022-06-07 (×2): 2.5 mg via INTRAVENOUS

## 2022-06-07 MED ORDER — MIDAZOLAM HCL 2 MG/2ML IJ SOLN
INTRAMUSCULAR | Status: AC
  Filled 2022-06-07: qty 2

## 2022-06-07 MED ORDER — MIDAZOLAM HCL 2 MG/2ML IJ SOLN
INTRAMUSCULAR | Status: DC | PRN
  Administered 2022-06-07: 1 mg via INTRAVENOUS

## 2022-06-07 SURGICAL SUPPLY — 10 items
CATH 5FR JL3.5 JR4 ANG PIG MP (CATHETERS) IMPLANT
DEVICE RAD TR BAND REGULAR (VASCULAR PRODUCTS) IMPLANT
DRAPE BRACHIAL (DRAPES) IMPLANT
GLIDESHEATH SLEND SS 6F .021 (SHEATH) IMPLANT
GUIDEWIRE INQWIRE 1.5J.035X260 (WIRE) IMPLANT
INQWIRE 1.5J .035X260CM (WIRE) ×1
PACK CARDIAC CATH (CUSTOM PROCEDURE TRAY) ×1 IMPLANT
PROTECTION STATION PRESSURIZED (MISCELLANEOUS) ×1
SET ATX-X65L (MISCELLANEOUS) IMPLANT
STATION PROTECTION PRESSURIZED (MISCELLANEOUS) IMPLANT

## 2022-06-08 ENCOUNTER — Encounter: Payer: Self-pay | Admitting: Cardiology

## 2022-09-14 ENCOUNTER — Ambulatory Visit
Admission: RE | Admit: 2022-09-14 | Discharge: 2022-09-14 | Disposition: A | Discharge status: Home / Self Care | Payer: BC Managed Care – PPO | Source: Ambulatory Visit | Attending: Obstetrics and Gynecology | Admitting: Obstetrics and Gynecology

## 2022-09-14 DIAGNOSIS — Z1231 Encounter for screening mammogram for malignant neoplasm of breast: Secondary | ICD-10-CM | POA: Insufficient documentation

## 2023-02-08 ENCOUNTER — Encounter: Payer: Self-pay | Admitting: Gastroenterology

## 2023-02-23 ENCOUNTER — Encounter: Payer: Self-pay | Admitting: Gastroenterology

## 2023-02-23 NOTE — H&P (Signed)
Pre-Procedure H&P   Patient ID: Stacy Mercer is a 52 y.o. female.  Gastroenterology Provider: Jaynie Collins, DO  Referring Provider: Fransico Setters, NP PCP: Christeen Douglas, MD  Date: 02/24/2023  HPI Ms. Stacy Mercer is a 52 y.o. female who presents today for Colonoscopy for screening colonoscopy, family history colon polyps.  Patient reports regular bowel movements without melena or hematochezia.  Her mother had colon polyps.  Potentially her paternal grandfather had colorectal cancer  Underwent echocardiogram in January 2024 demonstrating aortic insufficiency and ejection fraction of 40%. Denies chest pain and shortness of breath  Hemoglobin 13 MCV 89 platelets 213,000 creatinine 0.5   Past Medical History:  Diagnosis Date   CHF (congestive heart failure) (HCC)    Dilated cardiomyopathy (HCC)    Dyspnea    Elevated cholesterol    Heart murmur    Migraines    Pre-diabetes    Seasonal allergies     Past Surgical History:  Procedure Laterality Date   CARDIAC CATHETERIZATION     DILATION AND CURETTAGE OF UTERUS  04/04/1997   LEFT HEART CATH AND CORONARY ANGIOGRAPHY Left 06/07/2022   Procedure: LEFT HEART CATH AND CORONARY ANGIOGRAPHY;  Surgeon: Marcina Millard, MD;  Location: [facility] INVASIVE CV LAB;  Service: Cardiovascular;  Laterality: Left;    Family History Mother-colon polyps Maternal grandfather potential colorectal cancer No h/o GI disease or malignancy  Review of Systems  Constitutional:  Negative for activity change, appetite change, chills, diaphoresis, fatigue, fever and unexpected weight change.  HENT:  Negative for trouble swallowing and voice change.   Respiratory:  Negative for shortness of breath and wheezing.   Cardiovascular:  Negative for chest pain, palpitations and leg swelling.  Gastrointestinal:  Negative for abdominal distention, abdominal pain, anal bleeding, blood in stool, constipation, diarrhea, nausea, rectal pain and  vomiting.  Musculoskeletal:  Negative for arthralgias and myalgias.  Skin:  Negative for color change and pallor.  Neurological:  Negative for dizziness, syncope and weakness.  Psychiatric/Behavioral:  Negative for confusion.   All other systems reviewed and are negative.    Medications No current facility-administered medications on file prior to encounter.   Current Outpatient Medications on File Prior to Encounter  Medication Sig Dispense Refill   ivermectin (STROMECTOL) 3 MG TABS tablet Take 150 mcg/kg by mouth once.     levonorgestrel (MIRENA) 20 MCG/24HR IUD 1 each by Intrauterine route once.     losartan (COZAAR) 25 MG tablet Take 25 mg by mouth daily.     metoprolol succinate (TOPROL-XL) 25 MG 24 hr tablet Take 25 mg by mouth daily.      Pertinent medications related to GI and procedure were reviewed by me with the patient prior to the procedure   Current Facility-Administered Medications:    0.9 %  sodium chloride infusion, , Intravenous, Continuous, Jaynie Collins, DO, Last Rate: 20 mL/hr at 02/24/23 0811, 20 mL/hr at 02/24/23 0811  sodium chloride 20 mL/hr (02/24/23 0811)       No Known Allergies Allergies were reviewed by me prior to the procedure  Objective   Body mass index is 30.12 kg/m. Vitals:   02/24/23 0801  BP: (!) 177/71  Pulse: 87  Resp: 20  Temp: (!) 96.9 F (36.1 C)  TempSrc: Temporal  SpO2: 100%  Weight: 84.6 kg  Height: 5\' 6"  (1.676 m)     Physical Exam Vitals and nursing note reviewed.  Constitutional:      General: She is not in acute  distress.    Appearance: Normal appearance. She is not ill-appearing, toxic-appearing or diaphoretic.  HENT:     Head: Normocephalic and atraumatic.     Nose: Nose normal.     Mouth/Throat:     Mouth: Mucous membranes are moist.     Pharynx: Oropharynx is clear.  Eyes:     General: No scleral icterus.    Extraocular Movements: Extraocular movements intact.  Cardiovascular:     Rate and  Rhythm: Normal rate and regular rhythm.     Heart sounds: Murmur heard.     No friction rub. No gallop.  Pulmonary:     Effort: Pulmonary effort is normal. No respiratory distress.     Breath sounds: Normal breath sounds. No wheezing, rhonchi or rales.  Abdominal:     General: Abdomen is flat. Bowel sounds are normal. There is no distension.     Palpations: Abdomen is soft.     Tenderness: There is no abdominal tenderness. There is no guarding or rebound.  Musculoskeletal:     Cervical back: Neck supple.     Right lower leg: No edema.     Left lower leg: No edema.  Skin:    General: Skin is warm and dry.     Coloration: Skin is not jaundiced or pale.  Neurological:     General: No focal deficit present.     Mental Status: She is alert and oriented to person, place, and time. Mental status is at baseline.  Psychiatric:        Mood and Affect: Mood normal.        Behavior: Behavior normal.        Thought Content: Thought content normal.        Judgment: Judgment normal.      Assessment:  Stacy Mercer is a 52 y.o. female  who presents today for Colonoscopy for screening colonoscopy and family history of colon polyps .  Plan:  Colonoscopy with possible intervention today  Colonoscopy with possible biopsy, control of bleeding, polypectomy, and interventions as necessary has been discussed with the patient/patient representative. Informed consent was obtained from the patient/patient representative after explaining the indication, nature, and risks of the procedure including but not limited to death, bleeding, perforation, missed neoplasm/lesions, cardiorespiratory compromise, and reaction to medications. Opportunity for questions was given and appropriate answers were provided. Patient/patient representative has verbalized understanding is amenable to undergoing the procedure.   Jaynie Collins, DO  [facility]  Portions of the record may have  been created with voice recognition software. Occasional wrong-word or 'sound-a-like' substitutions may have occurred due to the inherent limitations of voice recognition software.  Read the chart carefully and recognize, using context, where substitutions may have occurred.

## 2023-02-24 ENCOUNTER — Encounter: Payer: Self-pay | Admitting: Gastroenterology

## 2023-02-24 ENCOUNTER — Ambulatory Visit: Payer: BC Managed Care – PPO | Admitting: Registered Nurse

## 2023-02-24 ENCOUNTER — Ambulatory Visit
Admission: RE | Admit: 2023-02-24 | Discharge: 2023-02-24 | Disposition: A | Discharge status: Home / Self Care | Payer: BC Managed Care – PPO | Attending: Gastroenterology | Admitting: Gastroenterology

## 2023-02-24 ENCOUNTER — Encounter
Admission: RE | Disposition: A | Discharge status: Home / Self Care | Payer: Self-pay | Source: Home / Self Care | Attending: Gastroenterology

## 2023-02-24 DIAGNOSIS — E669 Obesity, unspecified: Secondary | ICD-10-CM | POA: Diagnosis not present

## 2023-02-24 DIAGNOSIS — I509 Heart failure, unspecified: Secondary | ICD-10-CM | POA: Insufficient documentation

## 2023-02-24 DIAGNOSIS — Z83719 Family history of colon polyps, unspecified: Secondary | ICD-10-CM | POA: Insufficient documentation

## 2023-02-24 DIAGNOSIS — D123 Benign neoplasm of transverse colon: Secondary | ICD-10-CM | POA: Insufficient documentation

## 2023-02-24 DIAGNOSIS — Z1211 Encounter for screening for malignant neoplasm of colon: Secondary | ICD-10-CM | POA: Diagnosis present

## 2023-02-24 DIAGNOSIS — Z6830 Body mass index (BMI) 30.0-30.9, adult: Secondary | ICD-10-CM | POA: Insufficient documentation

## 2023-02-24 HISTORY — DX: Prediabetes: R73.03

## 2023-02-24 HISTORY — DX: Dyspnea, unspecified: R06.00

## 2023-02-24 HISTORY — PX: POLYPECTOMY: SHX5525

## 2023-02-24 HISTORY — DX: Pure hypercholesterolemia, unspecified: E78.00

## 2023-02-24 HISTORY — DX: Cardiac murmur, unspecified: R01.1

## 2023-02-24 HISTORY — DX: Dilated cardiomyopathy: I42.0

## 2023-02-24 HISTORY — PX: COLONOSCOPY WITH PROPOFOL: SHX5780

## 2023-02-24 HISTORY — DX: Heart failure, unspecified: I50.9

## 2023-02-24 LAB — POCT PREGNANCY, URINE: Preg Test, Ur: NEGATIVE

## 2023-02-24 SURGERY — COLONOSCOPY WITH PROPOFOL
Anesthesia: General

## 2023-02-24 MED ORDER — SODIUM CHLORIDE 0.9 % IV SOLN
INTRAVENOUS | Status: DC
  Administered 2023-02-24: 20 mL/h via INTRAVENOUS

## 2023-02-24 MED ORDER — LIDOCAINE HCL (PF) 2 % IJ SOLN
INTRAMUSCULAR | Status: AC
  Filled 2023-02-24: qty 5

## 2023-02-24 MED ORDER — PROPOFOL 500 MG/50ML IV EMUL
INTRAVENOUS | Status: DC | PRN
  Administered 2023-02-24: 100 ug/kg/min via INTRAVENOUS

## 2023-02-24 MED ORDER — LIDOCAINE HCL (CARDIAC) PF 100 MG/5ML IV SOSY
PREFILLED_SYRINGE | INTRAVENOUS | Status: DC | PRN
  Administered 2023-02-24: 40 mg via INTRAVENOUS

## 2023-02-24 MED ORDER — PROPOFOL 10 MG/ML IV BOLUS
INTRAVENOUS | Status: AC
  Filled 2023-02-24: qty 40

## 2023-02-24 MED ORDER — PROPOFOL 10 MG/ML IV BOLUS
INTRAVENOUS | Status: DC | PRN
  Administered 2023-02-24: 50 mg via INTRAVENOUS

## 2023-02-24 NOTE — Interval H&P Note (Signed)
History and Physical Interval Note: Preprocedure H&P from 02/24/23  was reviewed and there was no interval change after seeing and examining the patient.  Written consent was obtained from the patient after discussion of risks, benefits, and alternatives. Patient has consented to proceed with Colonoscopy with possible intervention   02/24/2023 8:19 AM  Stacy Mercer  has presented today for surgery, with the diagnosis of V18.51 (ICD-9-CM) - Z83.719 (ICD-10-CM) - FH: colon polyps.  The various methods of treatment have been discussed with the patient and family. After consideration of risks, benefits and other options for treatment, the patient has consented to  Procedure(s): COLONOSCOPY WITH PROPOFOL (N/A) as a surgical intervention.  The patient's history has been reviewed, patient examined, no change in status, stable for surgery.  I have reviewed the patient's chart and labs.  Questions were answered to the patient's satisfaction.     Jaynie Collins

## 2023-02-24 NOTE — Transfer of Care (Signed)
Immediate Anesthesia Transfer of Care Note  Patient: Stacy Mercer  Procedure(s) Performed: COLONOSCOPY WITH PROPOFOL POLYPECTOMY  Patient Location: PACU  Anesthesia Type:General  Level of Consciousness: awake, alert , and oriented  Airway & Oxygen Therapy: Patient Spontanous Breathing  Post-op Assessment: Report given to RN and Post -op Vital signs reviewed and stable  Post vital signs: stable  Last Vitals:  Vitals Value Taken Time  BP 105/50 02/24/23 0859  Temp 35.6 C 02/24/23 0859  Pulse 84 02/24/23 0900  Resp 15 02/24/23 0900  SpO2 98 % 02/24/23 0900  Vitals shown include unfiled device data.  Last Pain:  Vitals:   02/24/23 0859  TempSrc: Temporal  PainSc: 0-No pain         Complications: No notable events documented.

## 2023-02-24 NOTE — Anesthesia Preprocedure Evaluation (Signed)
Anesthesia Evaluation  Patient identified by MRN, date of birth, ID band Patient awake    Reviewed: Allergy & Precautions, NPO status , Patient's Chart, lab work & pertinent test results  Airway Mallampati: II  TM Distance: >3 FB Neck ROM: Full    Dental  (+) Teeth Intact   Pulmonary neg pulmonary ROS   Pulmonary exam normal breath sounds clear to auscultation       Cardiovascular Exercise Tolerance: Good +CHF  negative cardio ROS Normal cardiovascular exam Rhythm:Regular Rate:Normal     Neuro/Psych  Headaches negative neurological ROS  negative psych ROS   GI/Hepatic negative GI ROS, Neg liver ROS,,,  Endo/Other  negative endocrine ROS  Class 3 obesity  Renal/GU negative Renal ROS  negative genitourinary   Musculoskeletal negative musculoskeletal ROS (+)    Abdominal  (+) + obese  Peds negative pediatric ROS (+)  Hematology negative hematology ROS (+)   Anesthesia Other Findings Past Medical History: No date: CHF (congestive heart failure) (HCC) No date: Dilated cardiomyopathy (HCC) No date: Dyspnea No date: Elevated cholesterol No date: Heart murmur No date: Migraines No date: Pre-diabetes No date: Seasonal allergies  Past Surgical History: No date: CARDIAC CATHETERIZATION 04/04/1997: DILATION AND CURETTAGE OF UTERUS 06/07/2022: LEFT HEART CATH AND CORONARY ANGIOGRAPHY; Left     Comment:  Procedure: LEFT HEART CATH AND CORONARY ANGIOGRAPHY;                Surgeon: Marcina Millard, MD;  Location: [facility]               INVASIVE CV LAB;  Service: Cardiovascular;  Laterality:               Left;  BMI    Body Mass Index: 30.12 kg/m      Reproductive/Obstetrics negative OB ROS                             Anesthesia Physical Anesthesia Plan  ASA: 2  Anesthesia Plan: General   Post-op Pain Management:    Induction: Intravenous  PONV Risk Score and Plan: Propofol  infusion and TIVA  Airway Management Planned: Natural Airway and Nasal Cannula  Additional Equipment:   Intra-op Plan:   Post-operative Plan:   Informed Consent: I have reviewed the patients History and Physical, chart, labs and discussed the procedure including the risks, benefits and alternatives for the proposed anesthesia with the patient or authorized representative who has indicated his/her understanding and acceptance.     Dental Advisory Given  Plan Discussed with: CRNA and Surgeon  Anesthesia Plan Comments:        Anesthesia Quick Evaluation

## 2023-02-24 NOTE — Op Note (Signed)
[facility] Patient Name: Stacy Mercer Procedure Date: 02/24/2023 8:09 AM MRN: [id number] Account #: [id number] Date of Birth: [birth year] Admit Type: Outpatient Age: 52 Room: [facility] ENDO ROOM 1 Gender: Female Note Status: Finalized Instrument Name: Prentice Docker [id number] Procedure:             Colonoscopy Indications:           Colon cancer screening in patient at increased risk:                         Family history of 1st-degree relative with colon                         polyps at age 1 years (or older) Providers:             Trenda Moots, DO Referring MD:          Cline Cools (Referring MD) Medicines:             Monitored Anesthesia Care Complications:         No immediate complications. Estimated blood loss:                         Minimal. Procedure:             Pre-Anesthesia Assessment:                        - Prior to the procedure, a History and Physical was                         performed, and patient medications and allergies were                         reviewed. The patient is competent. The risks and                         benefits of the procedure and the sedation options and                         risks were discussed with the patient. All questions                         were answered and informed consent was obtained.                         Patient identification and proposed procedure were                         verified by the physician, the nurse, the anesthetist                         and the technician in the endoscopy suite. Mental                         Status Examination: alert and oriented. Airway                         Examination: normal oropharyngeal airway and neck  mobility. Respiratory Examination: clear to                         auscultation. CV Examination: systolic murmur.                         Prophylactic Antibiotics: The patient does not require                          prophylactic antibiotics. Prior Anticoagulants: The                         patient has taken no anticoagulant or antiplatelet                         agents. ASA Grade Assessment: III - A patient with                         severe systemic disease. After reviewing the risks and                         benefits, the patient was deemed in satisfactory                         condition to undergo the procedure. The anesthesia                         plan was to use monitored anesthesia care (MAC).                         Immediately prior to administration of medications,                         the patient was re-assessed for adequacy to receive                         sedatives. The heart rate, respiratory rate, oxygen                         saturations, blood pressure, adequacy of pulmonary                         ventilation, and response to care were monitored                         throughout the procedure. The physical status of the                         patient was re-assessed after the procedure.                        After obtaining informed consent, the colonoscope was                         passed under direct vision. Throughout the procedure,                         the patient's blood pressure, pulse, and oxygen  saturations were monitored continuously. The                         Colonoscope was introduced through the anus and                         advanced to the the cecum, identified by appendiceal                         orifice and ileocecal valve. The colonoscopy was                         somewhat difficult due to a redundant colon.                         Successful completion of the procedure was aided by                         using scope torsion, applying abdominal pressure and                         lavage. The patient tolerated the procedure well. The                         quality of the bowel preparation was  evaluated using                         the BBPS [facility] Bowel Preparation Scale) with scores                         of: Right Colon = 3 (entire mucosa seen well with no                         residual staining, small fragments of stool or opaque                         liquid), Transverse Colon = 2 (minor amount of                         residual staining, small fragments of stool and/or                         opaque liquid, but mucosa seen well) and Left Colon =                         2 (minor amount of residual staining, small fragments                         of stool and/or opaque liquid, but mucosa seen well).                         The total BBPS score equals 7. The quality of the                         bowel preparation was good. The ileocecal valve,  appendiceal orifice, and rectum were photographed. Findings:      The perianal and digital rectal examinations were normal. Pertinent       negatives include normal sphincter tone.      Four sessile polyps were found in the transverse colon. The polyps were       1 to 2 mm in size. These polyps were removed with a jumbo cold forceps.       Resection and retrieval were complete. Estimated blood loss was minimal.      The exam was otherwise without abnormality on direct and retroflexion       views. Impression:            - Four 1 to 2 mm polyps in the transverse colon,                         removed with a jumbo cold forceps. Resected and                         retrieved.                        - The examination was otherwise normal on direct and                         retroflexion views. Recommendation:        - Patient has a contact number available for                         emergencies. The signs and symptoms of potential                         delayed complications were discussed with the patient.                         Return to normal activities tomorrow. Written                          discharge instructions were provided to the patient.                        - Discharge patient to home.                        - Resume previous diet.                        - Continue present medications.                        - Await pathology results.                        - Repeat colonoscopy for surveillance based on                         pathology results.                        - Return to referring physician as previously  scheduled.                        - The findings and recommendations were discussed with                         the patient. Procedure Code(s):     --- Professional ---                        [phone], Colonoscopy, flexible; with biopsy, single or                         multiple Diagnosis Code(s):     --- Professional ---                        Z83.71, Family history of colonic polyps                        D12.3, Benign neoplasm of transverse colon (hepatic                         flexure or splenic flexure) CPT copyright 2022 [employer]. All rights reserved. The codes documented in this report are preliminary and upon coder review may  be revised to meet current compliance requirements. Attending Participation:      I personally performed the entire procedure. Elfredia Nevins, DO Jaynie Collins DO, DO 02/24/2023 9:00:00 AM This report has been signed electronically. Number of Addenda: 0 Note Initiated On: 02/24/2023 8:09 AM Scope Withdrawal Time: 0 hours 11 minutes 22 seconds  Total Procedure Duration: 0 hours 23 minutes 38 seconds  Estimated Blood Loss:  Estimated blood loss was minimal.      [facility]

## 2023-02-24 NOTE — Anesthesia Postprocedure Evaluation (Signed)
Anesthesia Post Note  Patient: Stacy Mercer  Procedure(s) Performed: COLONOSCOPY WITH PROPOFOL POLYPECTOMY  Patient location during evaluation: PACU Anesthesia Type: General Level of consciousness: awake and awake and alert Pain management: satisfactory to patient Vital Signs Assessment: post-procedure vital signs reviewed and stable Respiratory status: spontaneous breathing Cardiovascular status: stable Anesthetic complications: no   No notable events documented.   Last Vitals:  Vitals:   02/24/23 0859 02/24/23 0909  BP: (!) 105/50 (!) 121/57  Pulse: 83 72  Resp: 14 13  Temp: (!) 35.6 C   SpO2: 98% 100%    Last Pain:  Vitals:   02/24/23 0859  TempSrc: Temporal  PainSc: 0-No pain                 VAN STAVEREN,Tanicka Bisaillon

## 2023-02-27 LAB — SURGICAL PATHOLOGY

## 2023-02-28 ENCOUNTER — Encounter: Payer: Self-pay | Admitting: Gastroenterology

## 2023-06-13 ENCOUNTER — Other Ambulatory Visit: Payer: Self-pay | Admitting: Obstetrics and Gynecology

## 2023-06-13 DIAGNOSIS — Z1231 Encounter for screening mammogram for malignant neoplasm of breast: Secondary | ICD-10-CM

## 2024-02-02 DIAGNOSIS — Z952 Presence of prosthetic heart valve: Secondary | ICD-10-CM | POA: Insufficient documentation

## 2024-02-23 ENCOUNTER — Encounter: Attending: Cardiology | Admitting: *Deleted

## 2024-02-23 DIAGNOSIS — Z952 Presence of prosthetic heart valve: Secondary | ICD-10-CM | POA: Insufficient documentation

## 2024-02-23 DIAGNOSIS — I10 Essential (primary) hypertension: Secondary | ICD-10-CM | POA: Insufficient documentation

## 2024-02-23 DIAGNOSIS — Z48812 Encounter for surgical aftercare following surgery on the circulatory system: Secondary | ICD-10-CM | POA: Insufficient documentation

## 2024-02-23 NOTE — Progress Notes (Signed)
 Initial phone call completed. Diagnosis can be found in [facility] 11/17. EP Orientation scheduled for Wednesday 11/26 at 1:30.

## 2024-02-28 ENCOUNTER — Encounter: Admitting: *Deleted

## 2024-02-28 VITALS — Ht 65.5 in | Wt 178.2 lb

## 2024-02-28 DIAGNOSIS — Z48812 Encounter for surgical aftercare following surgery on the circulatory system: Secondary | ICD-10-CM | POA: Diagnosis present

## 2024-02-28 DIAGNOSIS — Z952 Presence of prosthetic heart valve: Secondary | ICD-10-CM

## 2024-02-28 NOTE — Progress Notes (Signed)
 Cardiac Individual Treatment Plan  Patient Details  Name: Stacy Mercer MRN: [id number] Date of Birth: 1971-03-11 Referring Provider:   Flowsheet Row Cardiac Rehab from 02/28/2024 in [facility] Cardiac and Pulmonary Rehab  Referring Provider Paraschos    Initial Encounter Date:  Flowsheet Row Cardiac Rehab from 02/28/2024 in [facility] Cardiac and Pulmonary Rehab  Date 02/28/24    Visit Diagnosis: S/P aortic valve replacement  Patient's Home Medications on Admission:  Current Outpatient Medications:    amiodarone (PACERONE) 200 MG tablet, Take 200 mg by mouth., Disp: , Rfl:    aspirin  81 MG chewable tablet, Chew 81 mg by mouth., Disp: , Rfl:    ivermectin (STROMECTOL) 3 MG TABS tablet, Take 150 mcg/kg by mouth once., Disp: , Rfl:    levonorgestrel (MIRENA) 20 MCG/24HR IUD, 1 each by Intrauterine route once., Disp: , Rfl:    losartan (COZAAR) 25 MG tablet, Take 25 mg by mouth daily., Disp: , Rfl:    metoprolol succinate (TOPROL-XL) 25 MG 24 hr tablet, Take 25 mg by mouth daily. (Patient not taking: Reported on 02/23/2024), Disp: , Rfl:    metoprolol tartrate (LOPRESSOR) 50 MG tablet, Take 50 mg by mouth., Disp: , Rfl:    Multiple Vitamin (MULTI-VITAMIN) tablet, Take 1 tablet by mouth daily., Disp: , Rfl:    warfarin (COUMADIN) 2 MG tablet, Take 5 mg by mouth., Disp: , Rfl:   Past Medical History: Past Medical History:  Diagnosis Date   CHF (congestive heart failure) (HCC)    Dilated cardiomyopathy (HCC)    Dyspnea    Elevated cholesterol    Heart murmur    Migraines    Pre-diabetes    Seasonal allergies     Tobacco Use: Social History   Tobacco Use  Smoking Status Never  Smokeless Tobacco Never    Labs: Review Flowsheet       Latest Ref Rng & Units 07/24/2015 08/04/2017 10/19/2018  Labs for ITP Cardiac and Pulmonary Rehab  Cholestrol 100 - 199 mg/dL 767  741  750   LDL (calc) 0 - 99 mg/dL 846  817  827   HDL-C >60 mg/dL 61  53  54   Trlycerides 0 - 149 mg/dL 91  885  885    Hemoglobin A1c 4.8 - 5.6 % - 5.9  5.8      Exercise Target Goals: Exercise Program Goal: Individual exercise prescription set using results from initial 6 min walk test and THRR while considering  patient's activity barriers and safety.   Exercise Prescription Goal: Initial exercise prescription builds to 30-45 minutes a day of aerobic activity, 2-3 days per week.  Home exercise guidelines will be given to patient during program as part of exercise prescription that the participant will acknowledge.   Education: Aerobic Exercise: - Group verbal and visual presentation on the components of exercise prescription. Introduces F.I.T.T principle from ACSM for exercise prescriptions.  Reviews F.I.T.T. principles of aerobic exercise including progression. Written material provided at class time.   Education: Resistance Exercise: - Group verbal and visual presentation on the components of exercise prescription. Introduces F.I.T.T principle from ACSM for exercise prescriptions  Reviews F.I.T.T. principles of resistance exercise including progression. Written material provided at class time.    Education: Exercise & Equipment Safety: - Individual verbal instruction and demonstration of equipment use and safety with use of the equipment. Flowsheet Row Cardiac Rehab from 02/28/2024 in [facility] Cardiac and Pulmonary Rehab  Date 02/28/24  Educator [facility]  Instruction Review Code 1- [employer]  Education: Exercise Physiology & General Exercise Guidelines: - Group verbal and written instruction with models to review the exercise physiology of the cardiovascular system and associated critical values. Provides general exercise guidelines with specific guidelines to those with heart or lung disease. Written material provided at class time.   Education: Flexibility, Balance, Mind/Body Relaxation: - Group verbal and visual presentation with interactive activity on the components of exercise  prescription. Introduces F.I.T.T principle from ACSM for exercise prescriptions. Reviews F.I.T.T. principles of flexibility and balance exercise training including progression. Also discusses the mind body connection.  Reviews various relaxation techniques to help reduce and manage stress (i.e. Deep breathing, progressive muscle relaxation, and visualization). Balance handout provided to take home. Written material provided at class time.   Activity Barriers & Risk Stratification:  Activity Barriers & Cardiac Risk Stratification - 02/28/24 1537       Activity Barriers & Cardiac Risk Stratification   Activity Barriers Incisional Pain;Back Problems    Cardiac Risk Stratification Moderate          6 Minute Walk:  6 Minute Walk     Row Name 02/28/24 1534         6 Minute Walk   Phase Initial     Distance 1340 feet     Walk Time 6 minutes     # of Rest Breaks 0     MPH 2.54     METS 3.58     RPE 11     Perceived Dyspnea  0     VO2 Peak 12.52     Symptoms No     Resting HR 65 bpm     Resting BP 130/80     Resting Oxygen Saturation  96 %     Exercise Oxygen Saturation  during 6 min walk 97 %     Max Ex. HR 89 bpm     Max Ex. BP 134/80     2 Minute Post BP 122/78        Oxygen Initial Assessment:   Oxygen Re-Evaluation:   Oxygen Discharge (Final Oxygen Re-Evaluation):   Initial Exercise Prescription:  Initial Exercise Prescription - 02/28/24 1500       Date of Initial Exercise RX and Referring Provider   Date 02/28/24    Referring Provider Paraschos      Oxygen   Maintain Oxygen Saturation 88% or higher      Treadmill   MPH 2.8    Grade 1    Minutes 15    METs 3.53      NuStep   Level 3    SPM 80    Minutes 15    METs 3.58      REL-XR   Level 2    Speed 50    Minutes 15    METs 3.58      Biostep-RELP   Level 2    SPM 50    Minutes 15    METs 3.58      Prescription Details   Frequency (times per week) 3    Duration Progress to 30 minutes  of continuous aerobic without signs/symptoms of physical distress      Intensity   THRR 40-80% of Max Heartrate 105-145    Ratings of Perceived Exertion 11-13    Perceived Dyspnea 0-4      Progression   Progression Continue to progress workloads to maintain intensity without signs/symptoms of physical distress.      Resistance Training   Training Prescription Yes  ROM only   Weight 0    Reps 10-15          Perform Capillary Blood Glucose checks as needed.  Exercise Prescription Changes:   Exercise Prescription Changes     Row Name 02/28/24 1500             Response to Exercise   Blood Pressure (Admit) 130/80       Blood Pressure (Exercise) 134/80       Blood Pressure (Exit) 122/78       Heart Rate (Admit) 65 bpm       Heart Rate (Exercise) 89 bpm       Heart Rate (Exit) 63 bpm       Oxygen Saturation (Admit) 96 %       Oxygen Saturation (Exercise) 97 %       Oxygen Saturation (Exit) 97 %       Rating of Perceived Exertion (Exercise) 11       Perceived Dyspnea (Exercise) 0       Symptoms none       Comments 6 MWT results          Exercise Comments:   Exercise Goals and Review:   Exercise Goals     Row Name 02/28/24 1544             Exercise Goals   Increase Physical Activity Yes       Intervention Provide advice, education, support and counseling about physical activity/exercise needs.;Develop an individualized exercise prescription for aerobic and resistive training based on initial evaluation findings, risk stratification, comorbidities and participant's personal goals.       Expected Outcomes Short Term: Attend rehab on a regular basis to increase amount of physical activity.;Long Term: Add in home exercise to make exercise part of routine and to increase amount of physical activity.;Long Term: Exercising regularly at least 3-5 days a week.       Increase Strength and Stamina Yes       Intervention Provide advice, education, support and counseling  about physical activity/exercise needs.;Develop an individualized exercise prescription for aerobic and resistive training based on initial evaluation findings, risk stratification, comorbidities and participant's personal goals.       Expected Outcomes Short Term: Increase workloads from initial exercise prescription for resistance, speed, and METs.;Short Term: Perform resistance training exercises routinely during rehab and add in resistance training at home;Long Term: Improve cardiorespiratory fitness, muscular endurance and strength as measured by increased METs and functional capacity ( )       Able to understand and use rate of perceived exertion (RPE) scale Yes       Intervention Provide education and explanation on how to use RPE scale       Expected Outcomes Short Term: Able to use RPE daily in rehab to express subjective intensity level;Long Term:  Able to use RPE to guide intensity level when exercising independently       Able to understand and use Dyspnea scale Yes       Intervention Provide education and explanation on how to use Dyspnea scale       Expected Outcomes Short Term: Able to use Dyspnea scale daily in rehab to express subjective sense of shortness of breath during exertion;Long Term: Able to use Dyspnea scale to guide intensity level when exercising independently       Knowledge and understanding of Target Heart Rate Range (THRR) Yes       Intervention Provide education and explanation of  THRR including how the numbers were predicted and where they are located for reference       Expected Outcomes Short Term: Able to state/look up THRR;Long Term: Able to use THRR to govern intensity when exercising independently;Short Term: Able to use daily as guideline for intensity in rehab       Able to check pulse independently Yes       Intervention Provide education and demonstration on how to check pulse in carotid and radial arteries.;Review the importance of being able to check your  own pulse for safety during independent exercise       Expected Outcomes Short Term: Able to explain why pulse checking is important during independent exercise;Long Term: Able to check pulse independently and accurately       Understanding of Exercise Prescription Yes       Intervention Provide education, explanation, and written materials on patient's individual exercise prescription       Expected Outcomes Short Term: Able to explain program exercise prescription;Long Term: Able to explain home exercise prescription to exercise independently          Exercise Goals Re-Evaluation :   Discharge Exercise Prescription (Final Exercise Prescription Changes):  Exercise Prescription Changes - 02/28/24 1500       Response to Exercise   Blood Pressure (Admit) 130/80    Blood Pressure (Exercise) 134/80    Blood Pressure (Exit) 122/78    Heart Rate (Admit) 65 bpm    Heart Rate (Exercise) 89 bpm    Heart Rate (Exit) 63 bpm    Oxygen Saturation (Admit) 96 %    Oxygen Saturation (Exercise) 97 %    Oxygen Saturation (Exit) 97 %    Rating of Perceived Exertion (Exercise) 11    Perceived Dyspnea (Exercise) 0    Symptoms none    Comments 6 MWT results          Nutrition:  Target Goals: Understanding of nutrition guidelines, daily intake of sodium 1500mg , cholesterol 200mg , calories 30% from fat and 7% or less from saturated fats, daily to have 5 or more servings of fruits and vegetables.  Education: Nutrition 1 -Group instruction provided by verbal, written material, interactive activities, discussions, models, and posters to present general guidelines for heart healthy nutrition including macronutrients, label reading, and promoting whole foods over processed counterparts. Education serves as pensions consultant of discussion of heart healthy eating for all. Written material provided at class time.    Education: Nutrition 2 -Group instruction provided by verbal, written material, interactive  activities, discussions, models, and posters to present general guidelines for heart healthy nutrition including sodium, cholesterol, and saturated fat. Providing guidance of habit forming to improve blood pressure, cholesterol, and body weight. Written material provided at class time.     Biometrics:  Pre Biometrics - 02/28/24 1545       Pre Biometrics   Height 5' 5.5 (1.664 m)    Weight 178 lb 3.2 oz (80.8 kg)    Waist Circumference 37 inches    Hip Circumference 45 inches    Waist to Hip Ratio 0.82 %    BMI (Calculated) 29.19    Single Leg Stand 30 seconds           Nutrition Therapy Plan and Nutrition Goals:  Nutrition Therapy & Goals - 02/28/24 1546       Intervention Plan   Intervention Prescribe, educate and counsel regarding individualized specific dietary modifications aiming towards targeted core components such as weight, hypertension, lipid management, diabetes,  heart failure and other comorbidities.    Expected Outcomes Short Term Goal: Understand basic principles of dietary content, such as calories, fat, sodium, cholesterol and nutrients.;Short Term Goal: A plan has been developed with personal nutrition goals set during dietitian appointment.;Long Term Goal: Adherence to prescribed nutrition plan.          Nutrition Assessments:  MEDIFICTS Score Key: >=70 Need to make dietary changes  40-70 Heart Healthy Diet <= 40 Therapeutic Level Cholesterol Diet  Flowsheet Row Cardiac Rehab from 02/28/2024 in [facility] Cardiac and Pulmonary Rehab  Picture Your Plate Total Score on Admission 77   Picture Your Plate Scores: <59 Unhealthy dietary pattern with much room for improvement. 41-50 Dietary pattern unlikely to meet recommendations for good health and room for improvement. 51-60 More healthful dietary pattern, with some room for improvement.  >60 Healthy dietary pattern, although there may be some specific behaviors that could be improved.    Nutrition Goals  Re-Evaluation:   Nutrition Goals Discharge (Final Nutrition Goals Re-Evaluation):   Psychosocial: Target Goals: Acknowledge presence or absence of significant depression and/or stress, maximize coping skills, provide positive support system. Participant is able to verbalize types and ability to use techniques and skills needed for reducing stress and depression.   Education: Stress, Anxiety, and Depression - Group verbal and visual presentation to define topics covered.  Reviews how body is impacted by stress, anxiety, and depression.  Also discusses healthy ways to reduce stress and to treat/manage anxiety and depression. Written material provided at class time.   Education: Sleep Hygiene -Provides group verbal and written instruction about how sleep can affect your health.  Define sleep hygiene, discuss sleep cycles and impact of sleep habits. Review good sleep hygiene tips.   Initial Review & Psychosocial Screening:  Initial Psych Review & Screening - 02/23/24 0952       Initial Review   Current issues with None Identified      Family Dynamics   Good Support System? Yes      Barriers   Psychosocial barriers to participate in program There are no identifiable barriers or psychosocial needs.      Screening Interventions   Interventions Encouraged to exercise;Provide feedback about the scores to participant;To provide support and resources with identified psychosocial needs    Expected Outcomes Short Term goal: Utilizing psychosocial counselor, staff and physician to assist with identification of specific Stressors or current issues interfering with healing process. Setting desired goal for each stressor or current issue identified.;Long Term Goal: Stressors or current issues are controlled or eliminated.;Short Term goal: Identification and review with participant of any Quality of Life or Depression concerns found by scoring the questionnaire.;Long Term goal: The participant improves  quality of Life and PHQ9 Scores as seen by post scores and/or verbalization of changes          Quality of Life Scores:   Quality of Life - 02/28/24 1547       Quality of Life   Select Quality of Life      Quality of Life Scores   Health/Function Pre 24.43 %    Socioeconomic Pre 30 %    Psych/Spiritual Pre 30 %    Family Pre 28.8 %    GLOBAL Pre 27.38 %         Scores of 19 and below usually indicate a poorer quality of life in these areas.  A difference of  2-3 points is a clinically meaningful difference.  A difference of 2-3 points in  the total score of the Quality of Life Index has been associated with significant improvement in overall quality of life, self-image, physical symptoms, and general health in studies assessing change in quality of life.  PHQ-9: Review Flowsheet       02/28/2024  Depression screen PHQ 2/9  Decreased Interest 0  Down, Depressed, Hopeless 0  PHQ - 2 Score 0  Altered sleeping 2  Tired, decreased energy 2  Change in appetite 0  Feeling bad or failure about yourself  0  Trouble concentrating 0  Moving slowly or fidgety/restless 0  Suicidal thoughts 0  PHQ-9 Score 4  Difficult doing work/chores Somewhat difficult   Interpretation of Total Score  Total Score Depression Severity:  1-4 = Minimal depression, 5-9 = Mild depression, 10-14 = Moderate depression, 15-19 = Moderately severe depression, 20-27 = Severe depression   Psychosocial Evaluation and Intervention:  Psychosocial Evaluation - 02/23/24 1001       Psychosocial Evaluation & Interventions   Interventions Encouraged to exercise with the program and follow exercise prescription    Comments Ms. Neils is coming to cardiac rehab after a valve replacement. She states she is healing well. She has still some chest discomfort and is learning what is her new normal after surgery. She mentions that her faith helps her get through all things in life and states she has no stress concerns.  She wants to come to the program to learn more about heart healthy living. 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   Vocational Rehab Evaluation & Intervention:  Vocational Rehab - 02/23/24 0952       Initial Vocational Rehab Evaluation & Intervention   Assessment shows need for Vocational Rehabilitation No          Education: Education Goals: Education classes will be provided on a variety of topics geared toward better understanding of heart health and risk factor modification. Participant will state understanding/return demonstration of topics presented as noted by education test scores.  Learning Barriers/Preferences:  Learning Barriers/Preferences - 02/23/24 0951       Learning Barriers/Preferences   Learning Barriers None    Learning Preferences Individual Instruction          General Cardiac Education Topics:  AED/CPR: - Group verbal and written instruction with the use of models to demonstrate the basic use of the AED with the basic ABC's of resuscitation.   Test and Procedures: - Group verbal and visual presentation and models provide information about basic cardiac anatomy and function. Reviews the testing methods done to diagnose heart disease and the outcomes of the test results. Describes the treatment choices: Medical Management, Angioplasty, or Coronary Bypass Surgery for treating various heart conditions including Myocardial Infarction, Angina, Valve Disease, and Cardiac  Arrhythmias. Written material provided at class time. Flowsheet Row Cardiac Rehab from 02/28/2024 in [facility] Cardiac and Pulmonary Rehab  Education need identified 02/28/24    Medication Safety: - Group verbal and visual instruction to review commonly prescribed medications for heart and lung disease. Reviews  the medication, class of the drug, and side effects. Includes the steps to properly store meds and maintain the prescription regimen. Written material provided at class time.   Intimacy: - Group verbal instruction through game format to discuss how heart and lung disease can affect sexual intimacy. Written material provided at class time.   Know Your Numbers and Heart Failure: - Group verbal and visual instruction to discuss disease risk factors for cardiac and pulmonary disease and treatment options.  Reviews associated critical values for Overweight/Obesity, Hypertension, Cholesterol, and Diabetes.  Discusses basics of heart failure: signs/symptoms and treatments.  Introduces Heart Failure Zone chart for action plan for heart failure. Written material provided at class time.   Infection Prevention: - Provides verbal and written material to individual with discussion of infection control including proper hand washing and proper equipment cleaning during exercise session. Flowsheet Row Cardiac Rehab from 02/28/2024 in [facility] Cardiac and Pulmonary Rehab  Date 02/28/24  Educator [facility]  Instruction Review Code 1- Verbalizes Understanding    Falls Prevention: - Provides verbal and written material to individual with discussion of falls prevention and safety. Flowsheet Row Cardiac Rehab from 02/28/2024 in [facility] Cardiac and Pulmonary Rehab  Date 02/28/24  Educator [facility]  Instruction Review Code 1- Verbalizes Understanding    Other: -Provides group and verbal instruction on various topics (see comments)   Knowledge Questionnaire Score:  Knowledge Questionnaire Score - 02/28/24 1547        Knowledge Questionnaire Score   Pre Score 23/26          Core Components/Risk Factors/Patient Goals at Admission:  Personal Goals and Risk Factors at Admission - 02/28/24 1546       Core Components/Risk Factors/Patient Goals on Admission    Weight Management Yes;Weight Loss    Intervention Weight Management: Develop a combined nutrition and exercise program designed to reach desired caloric intake, while maintaining appropriate intake of nutrient and fiber, sodium and fats, and appropriate energy expenditure required for the weight goal.;Weight Management: Provide education and appropriate resources to help participant work on and attain dietary goals.;Weight Management/Obesity: Establish reasonable short term and long term weight goals.    Admit Weight 178 lb 3.2 oz (80.8 kg)    Goal Weight: Short Term 170 lb (77.1 kg)    Goal Weight: Long Term 150 lb (68 kg)    Expected Outcomes Short Term: Continue to assess and modify interventions until short term weight is achieved;Long Term: Adherence to nutrition and physical activity/exercise program aimed toward attainment of established weight goal;Weight Loss: Understanding of general recommendations for a balanced deficit meal plan, which promotes 1-2 lb weight loss per week and includes a negative energy balance of [phone] kcal/d;Understanding recommendations for meals to include 15-35% energy as protein, 25-35% energy from fat, 35-60% energy from carbohydrates, less than 200mg  of dietary cholesterol, 20-35 gm of total fiber daily;Understanding of distribution of calorie intake throughout the day with the consumption of 4-5 meals/snacks    Hypertension Yes    Intervention Provide education on lifestyle modifcations including regular physical activity/exercise, weight management, moderate sodium restriction and increased consumption of fresh fruit, vegetables, and low fat dairy, alcohol moderation, and smoking cessation.;Monitor prescription use  compliance.    Expected Outcomes Long Term: Maintenance of blood pressure at goal levels.;Short Term: Continued assessment and intervention until BP is < 140/48mm HG in hypertensive participants. < 130/50mm HG in hypertensive participants with diabetes, heart failure or chronic kidney disease.          Education:Diabetes -  Individual verbal and written instruction to review signs/symptoms of diabetes, desired ranges of glucose level fasting, after meals and with exercise. Acknowledge that pre and post exercise glucose checks will be done for 3 sessions at entry of program.   Core Components/Risk Factors/Patient Goals Review:    Core Components/Risk Factors/Patient Goals at Discharge (Final Review):    ITP Comments:  ITP Comments     Row Name 02/23/24 1001 02/28/24 1534         ITP Comments Initial phone call completed. Diagnosis can be found in [facility] 11/17. EP Orientation scheduled for Wednesday 11/26 at 1:30. Completed and gym orientation for cardiac rehab. Initial ITP created and sent for review to Dr. Oneil Pinal, Medical Director.         Comments: initial ITP

## 2024-02-28 NOTE — Patient Instructions (Signed)
 Patient Instructions  Patient Details  Name: Stacy Mercer MRN: [id number] Date of Birth: 04/22/1970 Referring Provider:  Ammon Blunt, MD  Below are your personal goals for exercise, nutrition, and risk factors. Our goal is to help you stay on track towards obtaining and maintaining these goals. We will be discussing your progress on these goals with you throughout the program.  Initial Exercise Prescription:  Initial Exercise Prescription - 02/28/24 1500       Date of Initial Exercise RX and Referring Provider   Date 02/28/24    Referring Provider Paraschos      Oxygen   Maintain Oxygen Saturation 88% or higher      Treadmill   MPH 2.8    Grade 1    Minutes 15    METs 3.53      NuStep   Level 3    SPM 80    Minutes 15    METs 3.58      REL-XR   Level 2    Speed 50    Minutes 15    METs 3.58      Biostep-RELP   Level 2    SPM 50    Minutes 15    METs 3.58      Prescription Details   Frequency (times per week) 3    Duration Progress to 30 minutes of continuous aerobic without signs/symptoms of physical distress      Intensity   THRR 40-80% of Max Heartrate 105-145    Ratings of Perceived Exertion 11-13    Perceived Dyspnea 0-4      Progression   Progression Continue to progress workloads to maintain intensity without signs/symptoms of physical distress.      Resistance Training   Training Prescription Yes   ROM only   Weight 0    Reps 10-15          Exercise Goals: Frequency: Be able to perform aerobic exercise two to three times per week in program working toward 2-5 days per week of home exercise.  Intensity: Work with a perceived exertion of 11 (fairly light) - 15 (hard) while following your exercise prescription.  We will make changes to your prescription with you as you progress through the program.   Duration: Be able to do 30 to 45 minutes of continuous aerobic exercise in addition to a 5 minute warm-up and a 5 minute cool-down  routine.   Nutrition Goals: Your personal nutrition goals will be established when you do your nutrition analysis with the dietician.  The following are general nutrition guidelines to follow: Cholesterol < 200mg /day Sodium < 1500mg /day Fiber: Women over 50 yrs - 21 grams per day  Personal Goals:  Personal Goals and Risk Factors at Admission - 02/28/24 1546       Core Components/Risk Factors/Patient Goals on Admission    Weight Management Yes;Weight Loss    Intervention Weight Management: Develop a combined nutrition and exercise program designed to reach desired caloric intake, while maintaining appropriate intake of nutrient and fiber, sodium and fats, and appropriate energy expenditure required for the weight goal.;Weight Management: Provide education and appropriate resources to help participant work on and attain dietary goals.;Weight Management/Obesity: Establish reasonable short term and long term weight goals.    Admit Weight 178 lb 3.2 oz (80.8 kg)    Goal Weight: Short Term 170 lb (77.1 kg)    Goal Weight: Long Term 150 lb (68 kg)    Expected Outcomes Short Term: Continue to assess  and modify interventions until short term weight is achieved;Long Term: Adherence to nutrition and physical activity/exercise program aimed toward attainment of established weight goal;Weight Loss: Understanding of general recommendations for a balanced deficit meal plan, which promotes 1-2 lb weight loss per week and includes a negative energy balance of [phone] kcal/d;Understanding recommendations for meals to include 15-35% energy as protein, 25-35% energy from fat, 35-60% energy from carbohydrates, less than 200mg  of dietary cholesterol, 20-35 gm of total fiber daily;Understanding of distribution of calorie intake throughout the day with the consumption of 4-5 meals/snacks    Hypertension Yes    Intervention Provide education on lifestyle modifcations including regular physical activity/exercise,  weight management, moderate sodium restriction and increased consumption of fresh fruit, vegetables, and low fat dairy, alcohol moderation, and smoking cessation.;Monitor prescription use compliance.    Expected Outcomes Long Term: Maintenance of blood pressure at goal levels.;Short Term: Continued assessment and intervention until BP is < 140/39mm HG in hypertensive participants. < 130/18mm HG in hypertensive participants with diabetes, heart failure or chronic kidney disease.          Tobacco Use Initial Evaluation: Social History   Tobacco Use  Smoking Status Never  Smokeless Tobacco Never    Exercise Goals and Review:  Exercise Goals     Row Name 02/28/24 1544             Exercise Goals   Increase Physical Activity Yes       Intervention Provide advice, education, support and counseling about physical activity/exercise needs.;Develop an individualized exercise prescription for aerobic and resistive training based on initial evaluation findings, risk stratification, comorbidities and participant's personal goals.       Expected Outcomes Short Term: Attend rehab on a regular basis to increase amount of physical activity.;Long Term: Add in home exercise to make exercise part of routine and to increase amount of physical activity.;Long Term: Exercising regularly at least 3-5 days a week.       Increase Strength and Stamina Yes       Intervention Provide advice, education, support and counseling about physical activity/exercise needs.;Develop an individualized exercise prescription for aerobic and resistive training based on initial evaluation findings, risk stratification, comorbidities and participant's personal goals.       Expected Outcomes Short Term: Increase workloads from initial exercise prescription for resistance, speed, and METs.;Short Term: Perform resistance training exercises routinely during rehab and add in resistance training at home;Long Term: Improve cardiorespiratory  fitness, muscular endurance and strength as measured by increased METs and functional capacity ( )       Able to understand and use rate of perceived exertion (RPE) scale Yes       Intervention Provide education and explanation on how to use RPE scale       Expected Outcomes Short Term: Able to use RPE daily in rehab to express subjective intensity level;Long Term:  Able to use RPE to guide intensity level when exercising independently       Able to understand and use Dyspnea scale Yes       Intervention Provide education and explanation on how to use Dyspnea scale       Expected Outcomes Short Term: Able to use Dyspnea scale daily in rehab to express subjective sense of shortness of breath during exertion;Long Term: Able to use Dyspnea scale to guide intensity level when exercising independently       Knowledge and understanding of Target Heart Rate Range (THRR) Yes       Intervention  Provide education and explanation of THRR including how the numbers were predicted and where they are located for reference       Expected Outcomes Short Term: Able to state/look up THRR;Long Term: Able to use THRR to govern intensity when exercising independently;Short Term: Able to use daily as guideline for intensity in rehab       Able to check pulse independently Yes       Intervention Provide education and demonstration on how to check pulse in carotid and radial arteries.;Review the importance of being able to check your own pulse for safety during independent exercise       Expected Outcomes Short Term: Able to explain why pulse checking is important during independent exercise;Long Term: Able to check pulse independently and accurately       Understanding of Exercise Prescription Yes       Intervention Provide education, explanation, and written materials on patient's individual exercise prescription       Expected Outcomes Short Term: Able to explain program exercise prescription;Long Term: Able to explain  home exercise prescription to exercise independently          Copy of goals given to participant.

## 2024-03-04 ENCOUNTER — Encounter: Admitting: Emergency Medicine

## 2024-03-04 DIAGNOSIS — Z952 Presence of prosthetic heart valve: Secondary | ICD-10-CM | POA: Insufficient documentation

## 2024-03-04 NOTE — Progress Notes (Signed)
 Daily Session Note  Patient Details  Name: Stacy Mercer MRN: [id number] Date of Birth: 1970-05-31 Referring Provider:   Flowsheet Row Cardiac Rehab from 02/28/2024 in [facility] Cardiac and Pulmonary Rehab  Referring Provider Paraschos    Encounter Date: 03/04/2024  Check In:  Session Check In - 03/04/24 1555       Check-In   Supervising physician immediately available to respond to emergencies See telemetry face sheet for immediately available ER MD    Location [facility]-Cardiac & Pulmonary Rehab    Staff Present Leita Franks RN,BSN;Joseph [facility] BS, Exercise Physiologist;Margaret Best, MS, Exercise Physiologist    Virtual Visit No    Medication changes reported     No    Fall or balance concerns reported    No    Warm-up and Cool-down Performed on first and last piece of equipment    Resistance Training Performed Yes    VAD Patient? No    PAD/SET Patient? No      Pain Assessment   Currently in Pain? No/denies             Social History   Tobacco Use  Smoking Status Never  Smokeless Tobacco Never    Goals Met:  Independence with exercise equipment Exercise tolerated well No report of concerns or symptoms today Strength training completed today  Goals Unmet:  Not Applicable  Comments: First full day of exercise!  Patient was oriented to gym and equipment including functions, settings, policies, and procedures.  Patient's individual exercise prescription and treatment plan were reviewed.  All starting workloads were established based on the results of the 6 minute walk test done at initial orientation visit.  The plan for exercise progression was also introduced and progression will be customized based on patient's performance and goals.    Dr. Oneil Pinal is Medical Director for [facility] Cardiac Rehabilitation.  Dr. Fuad Aleskerov is Medical Director for [facility] Pulmonary Rehabilitation.

## 2024-03-06 ENCOUNTER — Encounter

## 2024-03-06 DIAGNOSIS — Z952 Presence of prosthetic heart valve: Secondary | ICD-10-CM

## 2024-03-06 NOTE — Progress Notes (Signed)
 Daily Session Note  Patient Details  Name: Stacy Mercer MRN: [id number] Date of Birth: Aug 01, 1970 Referring Provider:   Flowsheet Row Cardiac Rehab from 02/28/2024 in [facility] Cardiac and Pulmonary Rehab  Referring Provider Paraschos    Encounter Date: 03/06/2024  Check In:  Session Check In - 03/06/24 1543       Check-In   Supervising physician immediately available to respond to emergencies See telemetry face sheet for immediately available ER MD    Location [facility]-Cardiac & Pulmonary Rehab    Staff Present Burnard Davenport RN,BSN,MPA;Laura Cates RN,BSN;Joseph [facility] BS, ACSM CEP, Exercise Physiologist    Virtual Visit No    Medication changes reported     No    Fall or balance concerns reported    No    Warm-up and Cool-down Performed on first and last piece of equipment    Resistance Training Performed Yes    VAD Patient? No    PAD/SET Patient? No      Pain Assessment   Currently in Pain? No/denies             Social History   Tobacco Use  Smoking Status Never  Smokeless Tobacco Never    Goals Met:  Independence with exercise equipment Exercise tolerated well No report of concerns or symptoms today Strength training completed today  Goals Unmet:  Not Applicable  Comments: Pt able to follow exercise prescription today without complaint.  Will continue to monitor for progression.    Dr. Oneil Pinal is Medical Director for [facility] Cardiac Rehabilitation.  Dr. Fuad Aleskerov is Medical Director for [facility] Pulmonary Rehabilitation.

## 2024-03-07 ENCOUNTER — Encounter: Admitting: *Deleted

## 2024-03-07 DIAGNOSIS — Z952 Presence of prosthetic heart valve: Secondary | ICD-10-CM | POA: Diagnosis not present

## 2024-03-07 NOTE — Progress Notes (Signed)
 Daily Session Note  Patient Details  Name: Stacy Mercer MRN: [id number] Date of Birth: Dec 04, 1970 Referring Provider:   Flowsheet Row Cardiac Rehab from 02/28/2024 in [facility] Cardiac and Pulmonary Rehab  Referring Provider Paraschos    Encounter Date: 03/07/2024  Check In:  Session Check In - 03/07/24 1559       Check-In   Supervising physician immediately available to respond to emergencies See telemetry face sheet for immediately available ER MD    Location [facility]-Cardiac & Pulmonary Rehab    Staff Present Hoy Rodney RN,BSN;Joseph Rolinda [facility] Verlie Laird, BS, Exercise Physiologist;Ivar Domangue, RN, BSN, CCRP    Virtual Visit No    Medication changes reported     No    Fall or balance concerns reported    No    Warm-up and Cool-down Performed on first and last piece of equipment    Resistance Training Performed Yes    VAD Patient? No    PAD/SET Patient? No      Pain Assessment   Currently in Pain? No/denies             Social History   Tobacco Use  Smoking Status Never  Smokeless Tobacco Never    Goals Met:  Independence with exercise equipment Exercise tolerated well No report of concerns or symptoms today  Goals Unmet:  Not Applicable  Comments: Pt able to follow exercise prescription today without complaint.  Will continue to monitor for progression.    Dr. Oneil Pinal is Medical Director for [facility] Cardiac Rehabilitation.  Dr. Fuad Aleskerov is Medical Director for [facility] Pulmonary Rehabilitation.

## 2024-03-11 ENCOUNTER — Encounter: Admitting: Emergency Medicine

## 2024-03-11 DIAGNOSIS — Z952 Presence of prosthetic heart valve: Secondary | ICD-10-CM

## 2024-03-11 NOTE — Progress Notes (Signed)
 Daily Session Note  Patient Details  Name: Stacy Mercer MRN: [id number] Date of Birth: 08/02/1970 Referring Provider:   Flowsheet Row Cardiac Rehab from 02/28/2024 in [facility] Cardiac and Pulmonary Rehab  Referring Provider Paraschos    Encounter Date: 03/11/2024  Check In:  Session Check In - 03/11/24 1534       Check-In   Supervising physician immediately available to respond to emergencies See telemetry face sheet for immediately available ER MD    Location [facility]-Cardiac & Pulmonary Rehab    Staff Present Fairy Plater RCP,RRT,BSRT;Lekeshia Kram RN,BSN;Maxon Burnell BS, Exercise Physiologist;Margaret Best, MS, Exercise Physiologist    Virtual Visit No    Medication changes reported     Yes    Comments stopped amiodarone    Fall or balance concerns reported    No    Warm-up and Cool-down Performed on first and last piece of equipment    Resistance Training Performed Yes    VAD Patient? No    PAD/SET Patient? No      Pain Assessment   Currently in Pain? No/denies             Social History   Tobacco Use  Smoking Status Never  Smokeless Tobacco Never    Goals Met:  Independence with exercise equipment Exercise tolerated well No report of concerns or symptoms today Strength training completed today  Goals Unmet:  Not Applicable  Comments: Pt able to follow exercise prescription today without complaint.  Will continue to monitor for progression.    Dr. Oneil Pinal is Medical Director for [facility] Cardiac Rehabilitation.  Dr. Fuad Aleskerov is Medical Director for [facility] Pulmonary Rehabilitation.

## 2024-03-13 ENCOUNTER — Encounter: Admitting: Emergency Medicine

## 2024-03-13 DIAGNOSIS — Z952 Presence of prosthetic heart valve: Secondary | ICD-10-CM | POA: Diagnosis not present

## 2024-03-13 NOTE — Progress Notes (Signed)
 Daily Session Note  Patient Details  Name: Stacy Mercer MRN: [id number] Date of Birth: 02-22-1971 Referring Provider:   Flowsheet Row Cardiac Rehab from 02/28/2024 in [facility] Cardiac and Pulmonary Rehab  Referring Provider Paraschos    Encounter Date: 03/13/2024  Check In:  Session Check In - 03/13/24 1551       Check-In   Supervising physician immediately available to respond to emergencies See telemetry face sheet for immediately available ER MD    Location [facility]-Cardiac & Pulmonary Rehab    Staff Present Leita Franks RN,BSN;Joseph Rolinda RCP,RRT,BSRT;Meredith Tressa RN,BSN;Kelly [facility]    Virtual Visit No    Medication changes reported     No    Fall or balance concerns reported    No    Warm-up and Cool-down Performed on first and last piece of equipment    Resistance Training Performed Yes    VAD Patient? No    PAD/SET Patient? No      Pain Assessment   Currently in Pain? No/denies             Social History   Tobacco Use  Smoking Status Never  Smokeless Tobacco Never    Goals Met:  Independence with exercise equipment Exercise tolerated well No report of concerns or symptoms today Strength training completed today  Goals Unmet:  Not Applicable  Comments: Pt able to follow exercise prescription today without complaint.  Will continue to monitor for progression.    Dr. Oneil Pinal is Medical Director for [facility] Cardiac Rehabilitation.  Dr. Fuad Aleskerov is Medical Director for [facility] Pulmonary Rehabilitation.

## 2024-03-14 ENCOUNTER — Encounter: Admitting: *Deleted

## 2024-03-14 DIAGNOSIS — Z952 Presence of prosthetic heart valve: Secondary | ICD-10-CM

## 2024-03-14 NOTE — Progress Notes (Signed)
 Daily Session Note  Patient Details  Name: Stacy Mercer MRN: [id number] Date of Birth: Jul 27, 1970 Referring Provider:   Flowsheet Row Cardiac Rehab from 02/28/2024 in [facility] Cardiac and Pulmonary Rehab  Referring Provider Paraschos    Encounter Date: 03/14/2024  Check In:  Session Check In - 03/14/24 1600       Check-In   Supervising physician immediately available to respond to emergencies See telemetry face sheet for immediately available ER MD    Location [facility]-Cardiac & Pulmonary Rehab    Staff Present Hoy Rodney RN,BSN;Joseph [facility] BS, Exercise Physiologist;Noah Tickle, BS, Exercise Physiologist    Virtual Visit No    Medication changes reported     No    Fall or balance concerns reported    No    Warm-up and Cool-down Performed on first and last piece of equipment    Resistance Training Performed Yes    VAD Patient? No    PAD/SET Patient? No      Pain Assessment   Currently in Pain? No/denies             Tobacco Use History[1]  Goals Met:  Independence with exercise equipment Exercise tolerated well No report of concerns or symptoms today Strength training completed today  Goals Unmet:  Not Applicable  Comments: Pt able to follow exercise prescription today without complaint.  Will continue to monitor for progression.    Dr. Oneil Pinal is Medical Director for [facility] Cardiac Rehabilitation.  Dr. Fuad Aleskerov is Medical Director for [facility] Pulmonary Rehabilitation.    [1]  Social History Tobacco Use  Smoking Status Never  Smokeless Tobacco Never

## 2024-03-18 ENCOUNTER — Encounter: Admitting: Emergency Medicine

## 2024-03-18 ENCOUNTER — Encounter

## 2024-03-18 DIAGNOSIS — Z952 Presence of prosthetic heart valve: Secondary | ICD-10-CM | POA: Diagnosis not present

## 2024-03-18 NOTE — Progress Notes (Signed)
 Assessment start time: 4:33 PM  Digestive issues/concerns: no known food allergies  24-hours Recall: B: protein shake or bagel with cream cheese Snack: almonds  L: leftovers OR sandwich OR  Snack: apple  D: chicken or fish with salad or broccoli   Beverages water (~64oz), coffee, diet pepsi some   Education r/t nutrition plan Patient drinking 64oz of water daily. She eats 3 meals per day with a few snacks of healthy foods. Lately with holidays there have been more sweets, She is looking forward to getting back on track with her diet she was following before surgery and holidays. Reviewed mediterranean diet handout. Educated of types of fats, sources, and how to read labels.   Goal 1: Eat 15-30gProtein and 30-60gCarbs at each meal. Goal 2: Read labels and reduce sodium intake to below 2300mg .  Goal 3: Reduce saturated fat, less than 20g per day. Replace bad fats for more heart healthy fats.   End time 5:03 PM

## 2024-03-18 NOTE — Progress Notes (Signed)
 Daily Session Note  Patient Details  Name: Stacy Mercer MRN: [id number] Date of Birth: 11-Jan-1971 Referring Provider:   Flowsheet Row Cardiac Rehab from 02/28/2024 in [facility] Cardiac and Pulmonary Rehab  Referring Provider Paraschos    Encounter Date: 03/18/2024  Check In:  Session Check In - 03/18/24 1551       Check-In   Supervising physician immediately available to respond to emergencies See telemetry face sheet for immediately available ER MD    Location [facility]-Cardiac & Pulmonary Rehab    Staff Present Leita Franks RN,BSN;Joseph [facility] BS, Exercise Physiologist;Jason Elnor RDN,LDN    Virtual Visit No    Medication changes reported     No    Fall or balance concerns reported    No    Warm-up and Cool-down Performed on first and last piece of equipment    Resistance Training Performed Yes    VAD Patient? No    PAD/SET Patient? No      Pain Assessment   Currently in Pain? No/denies             Tobacco Use History[1]  Goals Met:  Independence with exercise equipment Exercise tolerated well No report of concerns or symptoms today Strength training completed today  Goals Unmet:  Not Applicable  Comments: Pt able to follow exercise prescription today without complaint.  Will continue to monitor for progression.    Dr. Oneil Pinal is Medical Director for [facility] Cardiac Rehabilitation.  Dr. Fuad Aleskerov is Medical Director for [facility] Pulmonary Rehabilitation.    [1]  Social History Tobacco Use  Smoking Status Never  Smokeless Tobacco Never

## 2024-03-20 ENCOUNTER — Encounter

## 2024-03-20 DIAGNOSIS — Z952 Presence of prosthetic heart valve: Secondary | ICD-10-CM | POA: Diagnosis not present

## 2024-03-20 NOTE — Progress Notes (Signed)
 Cardiac Individual Treatment Plan  Patient Details  Name: Stacy Mercer MRN: [id number] Date of Birth: February 13, 1971 Referring Provider:   Flowsheet Row Cardiac Rehab from 02/28/2024 in [facility] Cardiac and Pulmonary Rehab  Referring Provider Paraschos    Initial Encounter Date:  Flowsheet Row Cardiac Rehab from 02/28/2024 in [facility] Cardiac and Pulmonary Rehab  Date 02/28/24    Visit Diagnosis: S/P aortic valve replacement  Patient's Home Medications on Admission: Current Medications[1]  Past Medical History: Past Medical History:  Diagnosis Date   CHF (congestive heart failure) (HCC)    Dilated cardiomyopathy (HCC)    Dyspnea    Elevated cholesterol    Heart murmur    Migraines    Pre-diabetes    Seasonal allergies     Tobacco Use: Tobacco Use History[2]  Labs: Review Flowsheet       Latest Ref Rng & Units 07/24/2015 08/04/2017 10/19/2018  Labs for ITP Cardiac and Pulmonary Rehab  Cholestrol 100 - 199 mg/dL 767  741  750   LDL (calc) 0 - 99 mg/dL 846  817  827   HDL-C >60 mg/dL 61  53  54   Trlycerides 0 - 149 mg/dL 91  885  885   Hemoglobin A1c 4.8 - 5.6 % - 5.9  5.8      Exercise Target Goals: Exercise Program Goal: Individual exercise prescription set using results from initial 6 min walk test and THRR while considering  patients activity barriers and safety.   Exercise Prescription Goal: Initial exercise prescription builds to 30-45 minutes a day of aerobic activity, 2-3 days per week.  Home exercise guidelines will be given to patient during program as part of exercise prescription that the participant will acknowledge.   Education: Aerobic Exercise: - Group verbal and visual presentation on the components of exercise prescription. Introduces F.I.T.T principle from ACSM for exercise prescriptions.  Reviews F.I.T.T. principles of aerobic exercise including progression. Written material provided at class time.   Education: Resistance Exercise: - Group verbal  and visual presentation on the components of exercise prescription. Introduces F.I.T.T principle from ACSM for exercise prescriptions  Reviews F.I.T.T. principles of resistance exercise including progression. Written material provided at class time.    Education: Exercise & Equipment Safety: - Individual verbal instruction and demonstration of equipment use and safety with use of the equipment. Flowsheet Row Cardiac Rehab from 03/13/2024 in [facility] Cardiac and Pulmonary Rehab  Date 02/28/24  Educator [facility]  Instruction Review Code 1- Verbalizes Understanding    Education: Exercise Physiology & General Exercise Guidelines: - Group verbal and written instruction with models to review the exercise physiology of the cardiovascular system and associated critical values. Provides general exercise guidelines with specific guidelines to those with heart or lung disease. Written material provided at class time. Flowsheet Row Cardiac Rehab from 03/13/2024 in [facility] Cardiac and Pulmonary Rehab  Date 03/13/24  Educator kh  Instruction Review Code 1- [employer] Understanding    Education: Flexibility, Balance, Mind/Body Relaxation: - Group verbal and visual presentation with interactive activity on the components of exercise prescription. Introduces F.I.T.T principle from ACSM for exercise prescriptions. Reviews F.I.T.T. principles of flexibility and balance exercise training including progression. Also discusses the mind body connection.  Reviews various relaxation techniques to help reduce and manage stress (i.e. Deep breathing, progressive muscle relaxation, and visualization). Balance handout provided to take home. Written material provided at class time.   Activity Barriers & Risk Stratification:  Activity Barriers & Cardiac Risk Stratification - 02/28/24 1537  Activity Barriers & Cardiac Risk Stratification   Activity Barriers Incisional Pain;Back Problems    Cardiac Risk Stratification Moderate           6 Minute Walk:  6 Minute Walk     Row Name 02/28/24 1534         6 Minute Walk   Phase Initial     Distance 1340 feet     Walk Time 6 minutes     # of Rest Breaks 0     MPH 2.54     METS 3.58     RPE 11     Perceived Dyspnea  0     VO2 Peak 12.52     Symptoms No     Resting HR 65 bpm     Resting BP 130/80     Resting Oxygen Saturation  96 %     Exercise Oxygen Saturation  during 6 min walk 97 %     Max Ex. HR 89 bpm     Max Ex. BP 134/80     2 Minute Post BP 122/78        Oxygen Initial Assessment:   Oxygen Re-Evaluation:   Oxygen Discharge (Final Oxygen Re-Evaluation):   Initial Exercise Prescription:  Initial Exercise Prescription - 02/28/24 1500       Date of Initial Exercise RX and Referring Provider   Date 02/28/24    Referring Provider Paraschos      Oxygen   Maintain Oxygen Saturation 88% or higher      Treadmill   MPH 2.8    Grade 1    Minutes 15    METs 3.53      NuStep   Level 3    SPM 80    Minutes 15    METs 3.58      REL-XR   Level 2    Speed 50    Minutes 15    METs 3.58      Biostep-RELP   Level 2    SPM 50    Minutes 15    METs 3.58      Prescription Details   Frequency (times per week) 3    Duration Progress to 30 minutes of continuous aerobic without signs/symptoms of physical distress      Intensity   THRR 40-80% of Max Heartrate 105-145    Ratings of Perceived Exertion 11-13    Perceived Dyspnea 0-4      Progression   Progression Continue to progress workloads to maintain intensity without signs/symptoms of physical distress.      Resistance Training   Training Prescription Yes   ROM only   Weight 0    Reps 10-15          Perform Capillary Blood Glucose checks as needed.  Exercise Prescription Changes:   Exercise Prescription Changes     Row Name 02/28/24 1500 03/13/24 0800           Response to Exercise   Blood Pressure (Admit) 130/80 128/64      Blood Pressure (Exercise)  134/80 142/70      Blood Pressure (Exit) 122/78 120/62      Heart Rate (Admit) 65 bpm 67 bpm      Heart Rate (Exercise) 89 bpm 100 bpm      Heart Rate (Exit) 63 bpm 66 bpm      Oxygen Saturation (Admit) 96 % --      Oxygen Saturation (Exercise) 97 % --  Oxygen Saturation (Exit) 97 % --      Rating of Perceived Exertion (Exercise) 11 15      Perceived Dyspnea (Exercise) 0 --      Symptoms none none      Comments 6 MWT results first 2 weeks of exercise      Duration -- Progress to 30 minutes of  aerobic without signs/symptoms of physical distress      Intensity -- THRR unchanged        Progression   Progression -- Continue to progress workloads to maintain intensity without signs/symptoms of physical distress.      Average METs -- 2.95        Resistance Training   Training Prescription -- Yes  ROM only      Weight -- 0      Reps -- 10-15        Interval Training   Interval Training -- No        Treadmill   MPH -- 2.8      Grade -- 1      Minutes -- 15      METs -- 3.53        Recumbant Bike   Level -- 3      Watts -- 23      Minutes -- 15      METs -- 2.87        NuStep   Level -- 4  T6      Minutes -- 15      METs -- 2.8        T5 Nustep   Level -- 2      Minutes -- 15      METs -- 2        Biostep-RELP   Level -- 2      Minutes -- 15      METs -- 3        Oxygen   Maintain Oxygen Saturation -- 88% or higher         Exercise Comments:   Exercise Comments     Row Name 03/04/24 1556           Exercise Comments First full day of exercise!  Patient was oriented to gym and equipment including functions, settings, policies, and procedures.  Patient's individual exercise prescription and treatment plan were reviewed.  All starting workloads were established based on the results of the 6 minute walk test done at initial orientation visit.  The plan for exercise progression was also introduced and progression will be customized based on patient's  performance and goals.          Exercise Goals and Review:   Exercise Goals     Row Name 02/28/24 1544             Exercise Goals   Increase Physical Activity Yes       Intervention Provide advice, education, support and counseling about physical activity/exercise needs.;Develop an individualized exercise prescription for aerobic and resistive training based on initial evaluation findings, risk stratification, comorbidities and participant's personal goals.       Expected Outcomes Short Term: Attend rehab on a regular basis to increase amount of physical activity.;Long Term: Add in home exercise to make exercise part of routine and to increase amount of physical activity.;Long Term: Exercising regularly at least 3-5 days a week.       Increase Strength and Stamina Yes       Intervention Provide advice, education,  support and counseling about physical activity/exercise needs.;Develop an individualized exercise prescription for aerobic and resistive training based on initial evaluation findings, risk stratification, comorbidities and participant's personal goals.       Expected Outcomes Short Term: Increase workloads from initial exercise prescription for resistance, speed, and METs.;Short Term: Perform resistance training exercises routinely during rehab and add in resistance training at home;Long Term: Improve cardiorespiratory fitness, muscular endurance and strength as measured by increased METs and functional capacity ( )       Able to understand and use rate of perceived exertion (RPE) scale Yes       Intervention Provide education and explanation on how to use RPE scale       Expected Outcomes Short Term: Able to use RPE daily in rehab to express subjective intensity level;Long Term:  Able to use RPE to guide intensity level when exercising independently       Able to understand and use Dyspnea scale Yes       Intervention Provide education and explanation on how to use Dyspnea scale        Expected Outcomes Short Term: Able to use Dyspnea scale daily in rehab to express subjective sense of shortness of breath during exertion;Long Term: Able to use Dyspnea scale to guide intensity level when exercising independently       Knowledge and understanding of Target Heart Rate Range (THRR) Yes       Intervention Provide education and explanation of THRR including how the numbers were predicted and where they are located for reference       Expected Outcomes Short Term: Able to state/look up THRR;Long Term: Able to use THRR to govern intensity when exercising independently;Short Term: Able to use daily as guideline for intensity in rehab       Able to check pulse independently Yes       Intervention Provide education and demonstration on how to check pulse in carotid and radial arteries.;Review the importance of being able to check your own pulse for safety during independent exercise       Expected Outcomes Short Term: Able to explain why pulse checking is important during independent exercise;Long Term: Able to check pulse independently and accurately       Understanding of Exercise Prescription Yes       Intervention Provide education, explanation, and written materials on patient's individual exercise prescription       Expected Outcomes Short Term: Able to explain program exercise prescription;Long Term: Able to explain home exercise prescription to exercise independently          Exercise Goals Re-Evaluation :  Exercise Goals Re-Evaluation     Row Name 03/04/24 1556 03/13/24 0803           Exercise Goal Re-Evaluation   Exercise Goals Review Knowledge and understanding of Target Heart Rate Range (THRR);Understanding of Exercise Prescription;Able to understand and use rate of perceived exertion (RPE) scale;Increase Physical Activity;Increase Strength and Stamina;Able to understand and use Dyspnea scale;Able to check pulse independently Increase Physical Activity;Increase  Strength and Stamina;Understanding of Exercise Prescription      Comments Reviewed RPE and dyspnea scale, THR and program prescription with pt today.  Pt voiced understanding and was given a copy of goals to take home. Stacy is off to a good start in the program, and was able to attend her first 3 sessions during this review period. During these sessions she was able to use the treadmill at a workload of 2. and 1% incline,  and the T6 nustep at level 4. We will continue to monitor her progress in the program.      Expected Outcomes Short: Use RPE daily to regulate intensity.  Long: Follow program prescription in THR. Short: Continue to follow exercise prescription. Long: Continue exercise to improve strength and stamina.         Discharge Exercise Prescription (Final Exercise Prescription Changes):  Exercise Prescription Changes - 03/13/24 0800       Response to Exercise   Blood Pressure (Admit) 128/64    Blood Pressure (Exercise) 142/70    Blood Pressure (Exit) 120/62    Heart Rate (Admit) 67 bpm    Heart Rate (Exercise) 100 bpm    Heart Rate (Exit) 66 bpm    Rating of Perceived Exertion (Exercise) 15    Symptoms none    Comments first 2 weeks of exercise    Duration Progress to 30 minutes of  aerobic without signs/symptoms of physical distress    Intensity THRR unchanged      Progression   Progression Continue to progress workloads to maintain intensity without signs/symptoms of physical distress.    Average METs 2.95      Resistance Training   Training Prescription Yes   ROM only   Weight 0    Reps 10-15      Interval Training   Interval Training No      Treadmill   MPH 2.8    Grade 1    Minutes 15    METs 3.53      Recumbant Bike   Level 3    Watts 23    Minutes 15    METs 2.87      NuStep   Level 4   T6   Minutes 15    METs 2.8      T5 Nustep   Level 2    Minutes 15    METs 2      Biostep-RELP   Level 2    Minutes 15    METs 3      Oxygen    Maintain Oxygen Saturation 88% or higher          Nutrition:  Target Goals: Understanding of nutrition guidelines, daily intake of sodium 1500mg , cholesterol 200mg , calories 30% from fat and 7% or less from saturated fats, daily to have 5 or more servings of fruits and vegetables.  Education: Nutrition 1 -Group instruction provided by verbal, written material, interactive activities, discussions, models, and posters to present general guidelines for heart healthy nutrition including macronutrients, label reading, and promoting whole foods over processed counterparts. Education serves as pensions consultant of discussion of heart healthy eating for all. Written material provided at class time.    Education: Nutrition 2 -Group instruction provided by verbal, written material, interactive activities, discussions, models, and posters to present general guidelines for heart healthy nutrition including sodium, cholesterol, and saturated fat. Providing guidance of habit forming to improve blood pressure, cholesterol, and body weight. Written material provided at class time.     Biometrics:  Pre Biometrics - 02/28/24 1545       Pre Biometrics   Height 5' 5.5 (1.664 m)    Weight 178 lb 3.2 oz (80.8 kg)    Waist Circumference 37 inches    Hip Circumference 45 inches    Waist to Hip Ratio 0.82 %    BMI (Calculated) 29.19    Single Leg Stand 30 seconds  Nutrition Therapy Plan and Nutrition Goals:  Nutrition Therapy & Goals - 03/18/24 1712       Nutrition Therapy   Diet mediterranean    Protein (specify units) 80-100    Fiber 25 grams    Whole Grain Foods 3 servings    Saturated Fats 20 max. grams    Fruits and Vegetables 5 servings/day    Sodium 2 grams      Personal Nutrition Goals   Nutrition Goal Eat 15-30gProtein and 30-60gCarbs at each meal.    Personal Goal #2 Read labels and reduce sodium intake to below 2300mg .    Personal Goal #3 Reduce saturated fat, less than  20g per day. Replace bad fats for more heart healthy fats.    Comments Patient drinking 64oz of water daily. She eats 3 meals per day with a few snacks of healthy foods. Lately with holidays there have been more sweets, She is looking forward to getting back on track with her diet she was following before surgery and holidays. Reviewed mediterranean diet handout. Educated of types of fats, sources, and how to read labels.      Intervention Plan   Intervention Prescribe, educate and counsel regarding individualized specific dietary modifications aiming towards targeted core components such as weight, hypertension, lipid management, diabetes, heart failure and other comorbidities.;Nutrition handout(s) given to patient.    Expected Outcomes Short Term Goal: Understand basic principles of dietary content, such as calories, fat, sodium, cholesterol and nutrients.;Short Term Goal: A plan has been developed with personal nutrition goals set during dietitian appointment.;Long Term Goal: Adherence to prescribed nutrition plan.          Nutrition Assessments:  MEDIFICTS Score Key: >=70 Need to make dietary changes  40-70 Heart Healthy Diet <= 40 Therapeutic Level Cholesterol Diet  Flowsheet Row Cardiac Rehab from 02/28/2024 in [facility] Cardiac and Pulmonary Rehab  Picture Your Plate Total Score on Admission 77   Picture Your Plate Scores: <59 Unhealthy dietary pattern with much room for improvement. 41-50 Dietary pattern unlikely to meet recommendations for good health and room for improvement. 51-60 More healthful dietary pattern, with some room for improvement.  >60 Healthy dietary pattern, although there may be some specific behaviors that could be improved.    Nutrition Goals Re-Evaluation:   Nutrition Goals Discharge (Final Nutrition Goals Re-Evaluation):   Psychosocial: Target Goals: Acknowledge presence or absence of significant depression and/or stress, maximize coping skills, provide  positive support system. Participant is able to verbalize types and ability to use techniques and skills needed for reducing stress and depression.   Education: Stress, Anxiety, and Depression - Group verbal and visual presentation to define topics covered.  Reviews how body is impacted by stress, anxiety, and depression.  Also discusses healthy ways to reduce stress and to treat/manage anxiety and depression. Written material provided at class time. Flowsheet Row Cardiac Rehab from 03/13/2024 in [facility] Cardiac and Pulmonary Rehab  Date 03/06/24  Educator lc  Instruction Review Code 1- Verbalizes Understanding    Education: Sleep Hygiene -Provides group verbal and written instruction about how sleep can affect your health.  Define sleep hygiene, discuss sleep cycles and impact of sleep habits. Review good sleep hygiene tips.   Initial Review & Psychosocial Screening:  Initial Psych Review & Screening - 02/23/24 0952       Initial Review   Current issues with None Identified      Family Dynamics   Good Support System? Yes      Barriers  Psychosocial barriers to participate in program There are no identifiable barriers or psychosocial needs.      Screening Interventions   Interventions Encouraged to exercise;Provide feedback about the scores to participant;To provide support and resources with identified psychosocial needs    Expected Outcomes Short Term goal: Utilizing psychosocial counselor, staff and physician to assist with identification of specific Stressors or current issues interfering with healing process. Setting desired goal for each stressor or current issue identified.;Long Term Goal: Stressors or current issues are controlled or eliminated.;Short Term goal: Identification and review with participant of any Quality of Life or Depression concerns found by scoring the questionnaire.;Long Term goal: The participant improves quality of Life and PHQ9 Scores as seen by post scores  and/or verbalization of changes          Quality of Life Scores:   Quality of Life - 02/28/24 1547       Quality of Life   Select Quality of Life      Quality of Life Scores   Health/Function Pre 24.43 %    Socioeconomic Pre 30 %    Psych/Spiritual Pre 30 %    Family Pre 28.8 %    GLOBAL Pre 27.38 %         Scores of 19 and below usually indicate a poorer quality of life in these areas.  A difference of  2-3 points is a clinically meaningful difference.  A difference of 2-3 points in the total score of the Quality of Life Index has been associated with significant improvement in overall quality of life, self-image, physical symptoms, and general health in studies assessing change in quality of life.  PHQ-9: Review Flowsheet       02/28/2024  Depression screen PHQ 2/9  Decreased Interest 0  Down, Depressed, Hopeless 0  PHQ - 2 Score 0  Altered sleeping 2  Tired, decreased energy 2  Change in appetite 0  Feeling bad or failure about yourself  0  Trouble concentrating 0  Moving slowly or fidgety/restless 0  Suicidal thoughts 0  PHQ-9 Score 4  Difficult doing work/chores Somewhat difficult   Interpretation of Total Score  Total Score Depression Severity:  1-4 = Minimal depression, 5-9 = Mild depression, 10-14 = Moderate depression, 15-19 = Moderately severe depression, 20-27 = Severe depression   Psychosocial Evaluation and Intervention:  Psychosocial Evaluation - 02/23/24 1001       Psychosocial Evaluation & Interventions   Interventions Encouraged to exercise with the program and follow exercise prescription    Comments Ms. Mercer is coming to cardiac rehab after a valve replacement. She states she is healing well. She has still some chest discomfort and is learning what is her new normal after surgery. She mentions that her faith helps her get through all things in life and states she has no stress concerns. She wants to come to the program to learn more about  heart healthy living. 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   Vocational Rehab Evaluation & Intervention:  Vocational Rehab - 02/23/24 0952       Initial Vocational Rehab Evaluation & Intervention   Assessment shows need for Vocational Rehabilitation No          Education: Education Goals: Education classes will be provided on a variety of topics geared toward better understanding of heart health and risk factor modification. Participant will state understanding/return demonstration of topics presented as noted by education test scores.  Learning Barriers/Preferences:  Learning Barriers/Preferences - 02/23/24 0951       Learning Barriers/Preferences   Learning Barriers None    Learning Preferences Individual Instruction          General Cardiac Education Topics:  AED/CPR: - Group verbal and written instruction with the use of models to demonstrate the basic use of the AED with the basic ABC's of resuscitation.   Test and Procedures: - Group verbal and visual presentation and models provide information about basic cardiac anatomy and function. Reviews the testing methods done to diagnose heart disease and the outcomes of the test results. Describes the treatment choices: Medical Management, Angioplasty, or Coronary Bypass Surgery for treating various heart conditions including Myocardial Infarction, Angina, Valve Disease, and Cardiac Arrhythmias. Written material provided at class  time. Flowsheet Row Cardiac Rehab from 03/13/2024 in [facility] Cardiac and Pulmonary Rehab  Education need identified 02/28/24    Medication Safety: - Group verbal and visual instruction to review commonly prescribed medications for heart and lung disease. Reviews the medication, class of the drug, and side effects. Includes the steps to properly store meds and maintain the prescription regimen. Written material provided at class time.   Intimacy: - Group verbal instruction through game format to discuss how heart and lung disease can affect sexual intimacy. Written material provided at class time.   Know Your Numbers and Heart Failure: - Group verbal and visual instruction to discuss disease risk factors for cardiac and pulmonary disease and treatment options.  Reviews associated critical values for Overweight/Obesity, Hypertension, Cholesterol, and Diabetes.  Discusses basics of heart failure: signs/symptoms and treatments.  Introduces Heart Failure Zone chart for action plan for heart failure. Written material provided at class time.   Infection Prevention: - Provides verbal and written material to individual with discussion of infection control including proper hand washing and proper equipment cleaning during exercise session. Flowsheet Row Cardiac Rehab from 03/13/2024 in [facility] Cardiac and Pulmonary Rehab  Date 02/28/24  Educator [facility]  Instruction Review Code 1- Verbalizes Understanding    Falls Prevention: - Provides verbal and written material to individual with discussion of falls prevention and safety. Flowsheet Row Cardiac Rehab from 03/13/2024 in [facility] Cardiac and Pulmonary Rehab  Date 02/28/24  Educator [facility]  Instruction Review Code 1- Verbalizes Understanding    Other: -Provides group and verbal instruction on various topics (see comments)   Knowledge Questionnaire Score:  Knowledge Questionnaire Score - 02/28/24 1547       Knowledge Questionnaire Score   Pre Score 23/26           Core Components/Risk Factors/Patient Goals at Admission:  Personal Goals and Risk Factors at Admission - 02/28/24 1546       Core Components/Risk Factors/Patient Goals on Admission    Weight Management Yes;Weight Loss    Intervention Weight Management: Develop a combined nutrition and exercise program designed to reach desired caloric intake, while maintaining appropriate intake of nutrient and fiber, sodium and fats, and appropriate energy expenditure required for the weight goal.;Weight Management: Provide education and appropriate resources to help  participant work on and attain dietary goals.;Weight Management/Obesity: Establish reasonable short term and long term weight goals.    Admit Weight 178 lb 3.2 oz (80.8 kg)    Goal Weight: Short Term 170 lb (77.1 kg)    Goal Weight: Long Term 150 lb (68 kg)    Expected Outcomes Short Term: Continue to assess and modify interventions until short term weight is achieved;Long Term: Adherence to nutrition and physical activity/exercise program aimed toward attainment of established weight goal;Weight Loss: Understanding of general recommendations for a balanced deficit meal plan, which promotes 1-2 lb weight loss per week and includes a negative energy balance of [phone] kcal/d;Understanding recommendations for meals to include 15-35% energy as protein, 25-35% energy from fat, 35-60% energy from carbohydrates, less than 200mg  of dietary cholesterol, 20-35 gm of total fiber daily;Understanding of distribution of calorie intake throughout the day with the consumption of 4-5 meals/snacks    Hypertension Yes    Intervention Provide education on lifestyle modifcations including regular physical activity/exercise, weight management, moderate sodium restriction and increased consumption of fresh fruit, vegetables, and low fat dairy, alcohol moderation, and smoking cessation.;Monitor prescription use compliance.    Expected Outcomes Long Term:  Maintenance of blood pressure at goal levels.;Short Term: Continued assessment and intervention until BP is < 140/32mm HG in hypertensive participants. < 130/71mm HG in hypertensive participants with diabetes, heart failure or chronic kidney disease.          Education:Diabetes - Individual verbal and written instruction to review signs/symptoms of diabetes, desired ranges of glucose level fasting, after meals and with exercise. Acknowledge that pre and post exercise glucose checks will be done for 3 sessions at entry of program.   Core Components/Risk Factors/Patient Goals Review:    Core Components/Risk Factors/Patient Goals at Discharge (Final Review):    ITP Comments:  ITP Comments     Row Name 02/23/24 1001 02/28/24 1534 03/04/24 1556 03/20/24 0920     ITP Comments Initial phone call completed. Diagnosis can be found in [facility] 11/17. EP Orientation scheduled for Wednesday 11/26 at 1:30. Completed and gym orientation for cardiac rehab. Initial ITP created and sent for review to Dr. Oneil Pinal, Medical Director. First full day of exercise!  Patient was oriented to gym and equipment including functions, settings, policies, and procedures.  Patient's individual exercise prescription and treatment plan were reviewed.  All starting workloads were established based on the results of the 6 minute walk test done at initial orientation visit.  The plan for exercise progression was also introduced and progression will be customized based on patient's performance and goals. 30 Day review completed. Medical Director ITP review done, changes made as directed, and signed approval by Medical Director. New to program.       Comments: 30 day review     [1]  Current Outpatient Medications:    amiodarone (PACERONE) 200 MG tablet, Take 200 mg by mouth., Disp: , Rfl:    aspirin  81 MG chewable tablet, Chew 81 mg by mouth., Disp: , Rfl:    ivermectin (STROMECTOL) 3 MG TABS tablet, Take 150 mcg/kg by  mouth once., Disp: , Rfl:    levonorgestrel (MIRENA) 20 MCG/24HR IUD, 1 each by Intrauterine route once., Disp: , Rfl:    losartan (COZAAR) 25 MG tablet, Take 25 mg by mouth daily., Disp: , Rfl:    metoprolol succinate (TOPROL-XL) 25 MG 24 hr tablet, Take 25 mg by mouth daily. (Patient not taking: Reported on 02/23/2024), Disp: , Rfl:  metoprolol tartrate (LOPRESSOR) 50 MG tablet, Take 50 mg by mouth., Disp: , Rfl:    Multiple Vitamin (MULTI-VITAMIN) tablet, Take 1 tablet by mouth daily., Disp: , Rfl:    warfarin (COUMADIN) 2 MG tablet, Take 5 mg by mouth., Disp: , Rfl:  [2]  Social History Tobacco Use  Smoking Status Never  Smokeless Tobacco Never

## 2024-03-20 NOTE — Progress Notes (Signed)
 Daily Session Note  Patient Details  Name: Stacy Mercer MRN: [id number] Date of Birth: Dec 13, 1970 Referring Provider:   Flowsheet Row Cardiac Rehab from 02/28/2024 in [facility] Cardiac and Pulmonary Rehab  Referring Provider Paraschos    Encounter Date: 03/20/2024  Check In:  Session Check In - 03/20/24 1548       Check-In   Supervising physician immediately available to respond to emergencies See telemetry face sheet for immediately available ER MD    Location [facility]-Cardiac & Pulmonary Rehab    Staff Present Leita Franks RN,BSN;Joseph Rolinda RCP,RRT,BSRT;Kelly Bollinger RN,BSN,MPA;Meredith Tressa RN,BSN    Virtual Visit No    Medication changes reported     No    Fall or balance concerns reported    No    Warm-up and Cool-down Performed on first and last piece of equipment    Resistance Training Performed Yes    VAD Patient? No    PAD/SET Patient? No      Pain Assessment   Currently in Pain? No/denies             Tobacco Use History[1]  Goals Met:  Independence with exercise equipment Exercise tolerated well No report of concerns or symptoms today Strength training completed today  Goals Unmet:  Not Applicable  Comments: Pt able to follow exercise prescription today without complaint.  Will continue to monitor for progression.    Dr. Oneil Pinal is Medical Director for [facility] Cardiac Rehabilitation.  Dr. Fuad Aleskerov is Medical Director for [facility] Pulmonary Rehabilitation.    [1]  Social History Tobacco Use  Smoking Status Never  Smokeless Tobacco Never

## 2024-03-21 ENCOUNTER — Encounter

## 2024-03-21 DIAGNOSIS — Z952 Presence of prosthetic heart valve: Secondary | ICD-10-CM | POA: Diagnosis not present

## 2024-03-21 NOTE — Progress Notes (Signed)
 Daily Session Note  Patient Details  Name: Stacy Mercer MRN: [id number] Date of Birth: 11/19/1970 Referring Provider:   Flowsheet Row Cardiac Rehab from 02/28/2024 in [facility] Cardiac and Pulmonary Rehab  Referring Provider Paraschos    Encounter Date: 03/21/2024  Check In:  Session Check In - 03/21/24 1558       Check-In   Supervising physician immediately available to respond to emergencies See telemetry face sheet for immediately available ER MD    Location [facility]-Cardiac & Pulmonary Rehab    Staff Present Maxon Conetta BS, Exercise Physiologist;Joseph Hood RCP,RRT,BSRT;Tyquez Hollibaugh RN,BSN;Noah Tickle, [place], Exercise Physiologist    Virtual Visit No    Medication changes reported     No    Fall or balance concerns reported    No    Warm-up and Cool-down Performed on first and last piece of equipment    Resistance Training Performed Yes    VAD Patient? No    PAD/SET Patient? No      Pain Assessment   Currently in Pain? No/denies             Tobacco Use History[1]  Goals Met:  Independence with exercise equipment Exercise tolerated well No report of concerns or symptoms today Strength training completed today  Goals Unmet:  Not Applicable  Comments: Pt able to follow exercise prescription today without complaint.  Will continue to monitor for progression.    Dr. Oneil Pinal is Medical Director for [facility] Cardiac Rehabilitation.  Dr. Fuad Aleskerov is Medical Director for [facility] Pulmonary Rehabilitation.    [1]  Social History Tobacco Use  Smoking Status Never  Smokeless Tobacco Never

## 2024-03-25 ENCOUNTER — Encounter

## 2024-03-25 DIAGNOSIS — Z952 Presence of prosthetic heart valve: Secondary | ICD-10-CM | POA: Diagnosis not present

## 2024-03-25 NOTE — Progress Notes (Signed)
 Daily Session Note  Patient Details  Name: Stacy Mercer MRN: [id number] Date of Birth: 1970-10-18 Referring Provider:   Flowsheet Row Cardiac Rehab from 02/28/2024 in [facility] Cardiac and Pulmonary Rehab  Referring Provider Paraschos    Encounter Date: 03/25/2024  Check In:  Session Check In - 03/25/24 1553       Check-In   Supervising physician immediately available to respond to emergencies See telemetry face sheet for immediately available ER MD    Location [facility]-Cardiac & Pulmonary Rehab    Staff Present Leita Franks RN,BSN;Meredith Tressa RN,BSN;Margaret Best, MS, Exercise Physiologist;Kristen Coble RN,BC,MSN    Virtual Visit No    Medication changes reported     No    Fall or balance concerns reported    No    Warm-up and Cool-down Performed on first and last piece of equipment    Resistance Training Performed Yes    VAD Patient? No    PAD/SET Patient? No      Pain Assessment   Currently in Pain? No/denies             Tobacco Use History[1]  Goals Met:  Independence with exercise equipment Exercise tolerated well No report of concerns or symptoms today Strength training completed today  Goals Unmet:  Not Applicable  Comments: Pt able to follow exercise prescription today without complaint.  Will continue to monitor for progression.    Dr. Oneil Pinal is Medical Director for [facility] Cardiac Rehabilitation.  Dr. Fuad Aleskerov is Medical Director for [facility] Pulmonary Rehabilitation.    [1]  Social History Tobacco Use  Smoking Status Never  Smokeless Tobacco Never

## 2024-03-27 ENCOUNTER — Encounter

## 2024-04-01 ENCOUNTER — Encounter: Admitting: *Deleted

## 2024-04-01 ENCOUNTER — Encounter

## 2024-04-01 DIAGNOSIS — Z952 Presence of prosthetic heart valve: Secondary | ICD-10-CM | POA: Diagnosis not present

## 2024-04-01 NOTE — Progress Notes (Signed)
 Daily Session Note  Patient Details  Name: Stacy Mercer MRN: [id number] Date of Birth: [birth year] Referring Provider:   Flowsheet Row Cardiac Rehab from 02/28/2024 in [facility] Cardiac and Pulmonary Rehab  Referring Provider Paraschos    Encounter Date: 04/01/2024  Check In:  Session Check In - 04/01/24 1534       Check-In   Supervising physician immediately available to respond to emergencies See telemetry face sheet for immediately available ER MD    Location [facility]-Cardiac & Pulmonary Rehab    Staff Present Leita Franks RN,BSN;Arnoldo Hildreth Jacques RN,BSN;Joseph Rolinda RCP,RRT,BSRT;Jason Elnor RDN,LDN    Virtual Visit No    Medication changes reported     No    Fall or balance concerns reported    No    Warm-up and Cool-down Performed on first and last piece of equipment    Resistance Training Performed Yes    VAD Patient? No    PAD/SET Patient? No      Pain Assessment   Currently in Pain? No/denies             Tobacco Use History[1]  Goals Met:  Independence with exercise equipment Exercise tolerated well No report of concerns or symptoms today Strength training completed today  Goals Unmet:  Not Applicable  Comments: Pt able to follow exercise prescription today without complaint.  Will continue to monitor for progression.    Dr. Oneil Pinal is Medical Director for [facility] Cardiac Rehabilitation.  Dr. Fuad Aleskerov is Medical Director for [facility] Pulmonary Rehabilitation.    [1]  Social History Tobacco Use  Smoking Status Never  Smokeless Tobacco Never

## 2024-04-03 ENCOUNTER — Encounter

## 2024-04-04 ENCOUNTER — Encounter

## 2024-04-08 ENCOUNTER — Encounter: Attending: Cardiology | Admitting: Emergency Medicine

## 2024-04-08 DIAGNOSIS — Z952 Presence of prosthetic heart valve: Secondary | ICD-10-CM | POA: Insufficient documentation

## 2024-04-08 DIAGNOSIS — Z48812 Encounter for surgical aftercare following surgery on the circulatory system: Secondary | ICD-10-CM | POA: Insufficient documentation

## 2024-04-08 NOTE — Progress Notes (Signed)
 Daily Session Note  Patient Details  Name: Stacy Mercer MRN: [id number] Date of Birth: [birth year] Referring Provider:   Flowsheet Row Cardiac Rehab from 02/28/2024 in [facility] Cardiac and Pulmonary Rehab  Referring Provider Paraschos    Encounter Date: 04/08/2024  Check In:  Session Check In - 04/08/24 1532       Check-In   Supervising physician immediately available to respond to emergencies See telemetry face sheet for immediately available ER MD    Location [facility]-Cardiac & Pulmonary Rehab    Staff Present Leita Franks RN,BSN;Joseph [facility] BS, Exercise Physiologist;Margaret Best, MS, Exercise Physiologist    Virtual Visit No    Medication changes reported     No    Fall or balance concerns reported    No    Warm-up and Cool-down Performed on first and last piece of equipment    Resistance Training Performed Yes    VAD Patient? No    PAD/SET Patient? No      Pain Assessment   Currently in Pain? No/denies             Tobacco Use History[1]  Goals Met:  Independence with exercise equipment Exercise tolerated well No report of concerns or symptoms today Strength training completed today  Goals Unmet:  Not Applicable  Comments: Pt able to follow exercise prescription today without complaint.  Will continue to monitor for progression.    Dr. Oneil Pinal is Medical Director for [facility] Cardiac Rehabilitation.  Dr. Fuad Aleskerov is Medical Director for [facility] Pulmonary Rehabilitation.    [1]  Social History Tobacco Use  Smoking Status Never  Smokeless Tobacco Never

## 2024-04-10 ENCOUNTER — Encounter

## 2024-04-10 DIAGNOSIS — Z952 Presence of prosthetic heart valve: Secondary | ICD-10-CM

## 2024-04-10 NOTE — Progress Notes (Signed)
 Daily Session Note  Patient Details  Name: Stacy Mercer MRN: [id number] Date of Birth: [birth year] Referring Provider:   Flowsheet Row Cardiac Rehab from 02/28/2024 in [facility] Cardiac and Pulmonary Rehab  Referring Provider Paraschos    Encounter Date: 04/10/2024  Check In:  Session Check In - 04/10/24 1507       Check-In   Supervising physician immediately available to respond to emergencies See telemetry face sheet for immediately available ER MD    Location [facility]-Cardiac & Pulmonary Rehab    Staff Present Burnard Davenport RN,BSN,MPA;Laura Cates RN,BSN;Joseph [facility] BS, ACSM CEP, Exercise Physiologist    Virtual Visit No    Medication changes reported     No    Fall or balance concerns reported    No    Warm-up and Cool-down Performed on first and last piece of equipment    Resistance Training Performed Yes    VAD Patient? No    PAD/SET Patient? No      Pain Assessment   Currently in Pain? No/denies             Tobacco Use History[1]  Goals Met:  Independence with exercise equipment Exercise tolerated well No report of concerns or symptoms today Strength training completed today  Goals Unmet:  Not Applicable  Comments: Pt able to follow exercise prescription today without complaint.  Will continue to monitor for progression.    Dr. Oneil Pinal is Medical Director for [facility] Cardiac Rehabilitation.  Dr. Fuad Aleskerov is Medical Director for [facility] Pulmonary Rehabilitation.    [1]  Social History Tobacco Use  Smoking Status Never  Smokeless Tobacco Never

## 2024-04-11 ENCOUNTER — Encounter

## 2024-04-15 ENCOUNTER — Encounter: Admitting: Emergency Medicine

## 2024-04-15 DIAGNOSIS — Z952 Presence of prosthetic heart valve: Secondary | ICD-10-CM

## 2024-04-15 NOTE — Progress Notes (Signed)
 Daily Session Note  Patient Details  Name: Stacy Mercer MRN: [id number] Date of Birth: [birth year] Referring Provider:   Flowsheet Row Cardiac Rehab from 02/28/2024 in [facility] Cardiac and Pulmonary Rehab  Referring Provider Paraschos    Encounter Date: 04/15/2024  Check In:  Session Check In - 04/15/24 1541       Check-In   Supervising physician immediately available to respond to emergencies See telemetry face sheet for immediately available ER MD    Location [facility]-Cardiac & Pulmonary Rehab    Staff Present Leita Franks RN,BSN;Maxon Conetta BS, Exercise Physiologist;Joseph Rolinda RCP,RRT,BSRT;Jason Elnor RDN,LDN    Virtual Visit No    Medication changes reported     No    Fall or balance concerns reported    No    Warm-up and Cool-down Performed on first and last piece of equipment    Resistance Training Performed Yes    VAD Patient? No    PAD/SET Patient? No      Pain Assessment   Currently in Pain? No/denies             Tobacco Use History[1]  Goals Met:  Independence with exercise equipment Exercise tolerated well No report of concerns or symptoms today Strength training completed today  Goals Unmet:  Not Applicable  Comments: Pt able to follow exercise prescription today without complaint.  Will continue to monitor for progression.    Dr. Oneil Pinal is Medical Director for [facility] Cardiac Rehabilitation.  Dr. Fuad Aleskerov is Medical Director for [facility] Pulmonary Rehabilitation.    [1]  Social History Tobacco Use  Smoking Status Never  Smokeless Tobacco Never

## 2024-04-17 ENCOUNTER — Encounter: Payer: Self-pay | Admitting: *Deleted

## 2024-04-17 ENCOUNTER — Encounter

## 2024-04-17 DIAGNOSIS — Z952 Presence of prosthetic heart valve: Secondary | ICD-10-CM

## 2024-04-17 NOTE — Progress Notes (Signed)
 Cardiac Individual Treatment Plan  Patient Details  Name: Stacy Mercer MRN: [id number] Date of Birth: 1970/09/14 Referring Provider:   Flowsheet Row Cardiac Rehab from 02/28/2024 in [facility] Cardiac and Pulmonary Rehab  Referring Provider Paraschos    Initial Encounter Date:  Flowsheet Row Cardiac Rehab from 02/28/2024 in [facility] Cardiac and Pulmonary Rehab  Date 02/28/24    Visit Diagnosis: S/P aortic valve replacement  Patient's Home Medications on Admission: Current Medications[1]  Past Medical History: Past Medical History:  Diagnosis Date   CHF (congestive heart failure) (HCC)    Dilated cardiomyopathy (HCC)    Dyspnea    Elevated cholesterol    Heart murmur    Migraines    Pre-diabetes    Seasonal allergies     Tobacco Use: Tobacco Use History[2]  Labs: Review Flowsheet       Latest Ref Rng & Units 07/24/2015 08/04/2017 10/19/2018  Labs for ITP Cardiac and Pulmonary Rehab  Cholestrol 100 - 199 mg/dL 767  741  750   LDL (calc) 0 - 99 mg/dL 846  817  827   HDL-C >60 mg/dL 61  53  54   Trlycerides 0 - 149 mg/dL 91  885  885   Hemoglobin A1c 4.8 - 5.6 % - 5.9  5.8      Exercise Target Goals: Exercise Program Goal: Individual exercise prescription set using results from initial 6 min walk test and THRR while considering  patients activity barriers and safety.   Exercise Prescription Goal: Initial exercise prescription builds to 30-45 minutes a day of aerobic activity, 2-3 days per week.  Home exercise guidelines will be given to patient during program as part of exercise prescription that the participant will acknowledge.   Education: Aerobic Exercise: - Group verbal and visual presentation on the components of exercise prescription. Introduces F.I.T.T principle from ACSM for exercise prescriptions.  Reviews F.I.T.T. principles of aerobic exercise including progression. Written material provided at class time.   Education: Resistance Exercise: - Group verbal  and visual presentation on the components of exercise prescription. Introduces F.I.T.T principle from ACSM for exercise prescriptions  Reviews F.I.T.T. principles of resistance exercise including progression. Written material provided at class time. Flowsheet Row Cardiac Rehab from 04/10/2024 in [facility] Cardiac and Pulmonary Rehab  Date 03/20/24  Educator kh  Instruction Review Code 1- Verbalizes Understanding     Education: Exercise & Equipment Safety: - Individual verbal instruction and demonstration of equipment use and safety with use of the equipment. Flowsheet Row Cardiac Rehab from 04/10/2024 in [facility] Cardiac and Pulmonary Rehab  Date 02/28/24  Educator [facility]  Instruction Review Code 1- Verbalizes Understanding    Education: Exercise Physiology & General Exercise Guidelines: - Group verbal and written instruction with models to review the exercise physiology of the cardiovascular system and associated critical values. Provides general exercise guidelines with specific guidelines to those with heart or lung disease. Written material provided at class time. Flowsheet Row Cardiac Rehab from 04/10/2024 in [facility] Cardiac and Pulmonary Rehab  Date 03/13/24  Educator kh  Instruction Review Code 1- [employer] Understanding    Education: Flexibility, Balance, Mind/Body Relaxation: - Group verbal and visual presentation with interactive activity on the components of exercise prescription. Introduces F.I.T.T principle from ACSM for exercise prescriptions. Reviews F.I.T.T. principles of flexibility and balance exercise training including progression. Also discusses the mind body connection.  Reviews various relaxation techniques to help reduce and manage stress (i.e. Deep breathing, progressive muscle relaxation, and visualization). Balance handout provided to take home.  Written material provided at class time. Flowsheet Row Cardiac Rehab from 04/10/2024 in [facility] Cardiac and Pulmonary Rehab  Date 03/20/24   Educator kh  Instruction Review Code 1- Verbalizes Understanding    Activity Barriers & Risk Stratification:  Activity Barriers & Cardiac Risk Stratification - 02/28/24 1537       Activity Barriers & Cardiac Risk Stratification   Activity Barriers Incisional Pain;Back Problems    Cardiac Risk Stratification Moderate          6 Minute Walk:  6 Minute Walk     Row Name 02/28/24 1534         6 Minute Walk   Phase Initial     Distance 1340 feet     Walk Time 6 minutes     # of Rest Breaks 0     MPH 2.54     METS 3.58     RPE 11     Perceived Dyspnea  0     VO2 Peak 12.52     Symptoms No     Resting HR 65 bpm     Resting BP 130/80     Resting Oxygen Saturation  96 %     Exercise Oxygen Saturation  during 6 min walk 97 %     Max Ex. HR 89 bpm     Max Ex. BP 134/80     2 Minute Post BP 122/78        Oxygen Initial Assessment:   Oxygen Re-Evaluation:   Oxygen Discharge (Final Oxygen Re-Evaluation):   Initial Exercise Prescription:  Initial Exercise Prescription - 02/28/24 1500       Date of Initial Exercise RX and Referring Provider   Date 02/28/24    Referring Provider Paraschos      Oxygen   Maintain Oxygen Saturation 88% or higher      Treadmill   MPH 2.8    Grade 1    Minutes 15    METs 3.53      NuStep   Level 3    SPM 80    Minutes 15    METs 3.58      REL-XR   Level 2    Speed 50    Minutes 15    METs 3.58      Biostep-RELP   Level 2    SPM 50    Minutes 15    METs 3.58      Prescription Details   Frequency (times per week) 3    Duration Progress to 30 minutes of continuous aerobic without signs/symptoms of physical distress      Intensity   THRR 40-80% of Max Heartrate 105-145    Ratings of Perceived Exertion 11-13    Perceived Dyspnea 0-4      Progression   Progression Continue to progress workloads to maintain intensity without signs/symptoms of physical distress.      Resistance Training   Training Prescription  Yes   ROM only   Weight 0    Reps 10-15          Perform Capillary Blood Glucose checks as needed.  Exercise Prescription Changes:   Exercise Prescription Changes     Row Name 02/28/24 1500 03/13/24 0800 04/02/24 1500 04/08/24 1500       Response to Exercise   Blood Pressure (Admit) 130/80 128/64 164/88 --    Blood Pressure (Exercise) 134/80 142/70 146/84 --    Blood Pressure (Exit) 122/78 120/62 102/62 --    Heart  Rate (Admit) 65 bpm 67 bpm 74 bpm --    Heart Rate (Exercise) 89 bpm 100 bpm 124 bpm --    Heart Rate (Exit) 63 bpm 66 bpm 77 bpm --    Oxygen Saturation (Admit) 96 % -- -- --    Oxygen Saturation (Exercise) 97 % -- -- --    Oxygen Saturation (Exit) 97 % -- -- --    Rating of Perceived Exertion (Exercise) 11 15 15  --    Perceived Dyspnea (Exercise) 0 -- -- --    Symptoms none none none --    Comments 6 MWT results first 2 weeks of exercise -- --    Duration -- Progress to 30 minutes of  aerobic without signs/symptoms of physical distress Progress to 30 minutes of  aerobic without signs/symptoms of physical distress --    Intensity -- THRR unchanged THRR unchanged --      Progression   Progression -- Continue to progress workloads to maintain intensity without signs/symptoms of physical distress. Continue to progress workloads to maintain intensity without signs/symptoms of physical distress. --    Average METs -- 2.95 3.7 --      Resistance Training   Training Prescription -- Yes  ROM only Yes  ROM only --    Weight -- 0 0 --    Reps -- 10-15 10-15 --      Interval Training   Interval Training -- No No --      Treadmill   MPH -- 2.8 3.2 --    Grade -- 1 1.5 --    Minutes -- 15 15 --    METs -- 3.53 4.11 --      Recumbant Bike   Level -- 3 2.5 --    Watts -- 23 60 --    Minutes -- 15 15 --    METs -- 2.87 4.28 --      NuStep   Level -- 4  T6 5  T6 --    Minutes -- 15 15 --    METs -- 2.8 2.7 --      REL-XR   Level -- -- 8 --    Minutes -- --  15 --    METs -- -- 6 --      T5 Nustep   Level -- 2 3  T4 and T5 --    Minutes -- 15 15 --    METs -- 2 2 --      Biostep-RELP   Level -- 2 -- --    Minutes -- 15 -- --    METs -- 3 -- --      Home Exercise Plan   Plans to continue exercise at -- -- -- Home (comment)  Kaidan plans to continue to walk more outside as well as look into joining a community fitness center.    Frequency -- -- -- Add 2 additional days to program exercise sessions.    Initial Home Exercises Provided -- -- -- 04/08/24      Oxygen   Maintain Oxygen Saturation -- 88% or higher 88% or higher --       Exercise Comments:   Exercise Comments     Row Name 03/04/24 1556           Exercise Comments First full day of exercise!  Patient was oriented to gym and equipment including functions, settings, policies, and procedures.  Patient's individual exercise prescription and treatment plan were reviewed.  All starting workloads were established  based on the results of the 6 minute walk test done at initial orientation visit.  The plan for exercise progression was also introduced and progression will be customized based on patient's performance and goals.          Exercise Goals and Review:   Exercise Goals     Row Name 02/28/24 1544             Exercise Goals   Increase Physical Activity Yes       Intervention Provide advice, education, support and counseling about physical activity/exercise needs.;Develop an individualized exercise prescription for aerobic and resistive training based on initial evaluation findings, risk stratification, comorbidities and participant's personal goals.       Expected Outcomes Short Term: Attend rehab on a regular basis to increase amount of physical activity.;Long Term: Add in home exercise to make exercise part of routine and to increase amount of physical activity.;Long Term: Exercising regularly at least 3-5 days a week.       Increase Strength and Stamina Yes        Intervention Provide advice, education, support and counseling about physical activity/exercise needs.;Develop an individualized exercise prescription for aerobic and resistive training based on initial evaluation findings, risk stratification, comorbidities and participant's personal goals.       Expected Outcomes Short Term: Increase workloads from initial exercise prescription for resistance, speed, and METs.;Short Term: Perform resistance training exercises routinely during rehab and add in resistance training at home;Long Term: Improve cardiorespiratory fitness, muscular endurance and strength as measured by increased METs and functional capacity ( )       Able to understand and use rate of perceived exertion (RPE) scale Yes       Intervention Provide education and explanation on how to use RPE scale       Expected Outcomes Short Term: Able to use RPE daily in rehab to express subjective intensity level;Long Term:  Able to use RPE to guide intensity level when exercising independently       Able to understand and use Dyspnea scale Yes       Intervention Provide education and explanation on how to use Dyspnea scale       Expected Outcomes Short Term: Able to use Dyspnea scale daily in rehab to express subjective sense of shortness of breath during exertion;Long Term: Able to use Dyspnea scale to guide intensity level when exercising independently       Knowledge and understanding of Target Heart Rate Range (THRR) Yes       Intervention Provide education and explanation of THRR including how the numbers were predicted and where they are located for reference       Expected Outcomes Short Term: Able to state/look up THRR;Long Term: Able to use THRR to govern intensity when exercising independently;Short Term: Able to use daily as guideline for intensity in rehab       Able to check pulse independently Yes       Intervention Provide education and demonstration on how to check pulse in carotid and  radial arteries.;Review the importance of being able to check your own pulse for safety during independent exercise       Expected Outcomes Short Term: Able to explain why pulse checking is important during independent exercise;Long Term: Able to check pulse independently and accurately       Understanding of Exercise Prescription Yes       Intervention Provide education, explanation, and written materials on patient's individual exercise prescription  Expected Outcomes Short Term: Able to explain program exercise prescription;Long Term: Able to explain home exercise prescription to exercise independently          Exercise Goals Re-Evaluation :  Exercise Goals Re-Evaluation     Row Name 03/04/24 1556 03/13/24 0803 04/02/24 1507 04/08/24 1549 04/10/24 1600     Exercise Goal Re-Evaluation   Exercise Goals Review Knowledge and understanding of Target Heart Rate Range (THRR);Understanding of Exercise Prescription;Able to understand and use rate of perceived exertion (RPE) scale;Increase Physical Activity;Increase Strength and Stamina;Able to understand and use Dyspnea scale;Able to check pulse independently Increase Physical Activity;Increase Strength and Stamina;Understanding of Exercise Prescription Increase Physical Activity;Understanding of Exercise Prescription;Increase Strength and Stamina Increase Physical Activity;Understanding of Exercise Prescription;Able to understand and use Dyspnea scale;Knowledge and understanding of Target Heart Rate Range (THRR);Increase Strength and Stamina;Able to understand and use rate of perceived exertion (RPE) scale;Able to check pulse independently Increase Physical Activity;Increase Strength and Stamina;Understanding of Exercise Prescription   Comments Reviewed RPE and dyspnea scale, THR and program prescription with pt today.  Pt voiced understanding and was given a copy of goals to take home. Stacy Mercer is off to a good start in the program, and was able to  attend her first 3 sessions during this review period. During these sessions she was able to use the treadmill at a workload of 2. and 1% incline, and the T6 nustep at level 4. We will continue to monitor her progress in the program. Stacy Mercer is doing well in rehab. She increased her workload on the treadmill to a speed of 3.2 mph and incline of 1.5%. She also increased her average watts on the recumbent bike to 60 watts at level 2.5. She also increased to level 5 on the T6 nustep, level 3 on the T5 nustep, and level 8 on the XR. We will continue to monitor her progress in the program. Reviewed home exercise with pt today.  Pt plans to walk outside, and look into joining a gym for exercise.  Reviewed THR, pulse, RPE, sign and symptoms, pulse oximetery and when to call 911 or MD.  Also discussed weather considerations and indoor options.  Pt voiced understanding. Spoke with Stacy Mercer about adding home exercise into her routine. she says she spoke with EP earlier this week and will be implementing a plan to go more home exercise in next week.   Expected Outcomes Short: Use RPE daily to regulate intensity.  Long: Follow program prescription in THR. Short: Continue to follow exercise prescription. Long: Continue exercise to improve strength and stamina. Short: Continue to progressively increase treadmill workload. Long: Continue exercise to improve strength and stamina. Short: Join community fitness center for additional at home exercise. Long: Continue exercise to improve strength and stamina. STG: Make plan to get more home exercise. LTG: Continue exercise to improve strength and stamina.      Discharge Exercise Prescription (Final Exercise Prescription Changes):  Exercise Prescription Changes - 04/08/24 1500       Home Exercise Plan   Plans to continue exercise at Home (comment)   Reha plans to continue to walk more outside as well as look into joining a community fitness center.   Frequency Add 2  additional days to program exercise sessions.    Initial Home Exercises Provided 04/08/24          Nutrition:  Target Goals: Understanding of nutrition guidelines, daily intake of sodium 1500mg , cholesterol 200mg , calories 30% from fat and 7% or less from saturated fats,  daily to have 5 or more servings of fruits and vegetables.  Education: Nutrition 1 -Group instruction provided by verbal, written material, interactive activities, discussions, models, and posters to present general guidelines for heart healthy nutrition including macronutrients, label reading, and promoting whole foods over processed counterparts. Education serves as pensions consultant of discussion of heart healthy eating for all. Written material provided at class time.    Education: Nutrition 2 -Group instruction provided by verbal, written material, interactive activities, discussions, models, and posters to present general guidelines for heart healthy nutrition including sodium, cholesterol, and saturated fat. Providing guidance of habit forming to improve blood pressure, cholesterol, and body weight. Written material provided at class time.     Biometrics:  Pre Biometrics - 02/28/24 1545       Pre Biometrics   Height 5' 5.5 (1.664 m)    Weight 178 lb 3.2 oz (80.8 kg)    Waist Circumference 37 inches    Hip Circumference 45 inches    Waist to Hip Ratio 0.82 %    BMI (Calculated) 29.19    Single Leg Stand 30 seconds           Nutrition Therapy Plan and Nutrition Goals:  Nutrition Therapy & Goals - 03/18/24 1712       Nutrition Therapy   Diet mediterranean    Protein (specify units) 80-100    Fiber 25 grams    Whole Grain Foods 3 servings    Saturated Fats 20 max. grams    Fruits and Vegetables 5 servings/day    Sodium 2 grams      Personal Nutrition Goals   Nutrition Goal Eat 15-30gProtein and 30-60gCarbs at each meal.    Personal Goal #2 Read labels and reduce sodium intake to below 2300mg .     Personal Goal #3 Reduce saturated fat, less than 20g per day. Replace bad fats for more heart healthy fats.    Comments Patient drinking 64oz of water daily. She eats 3 meals per day with a few snacks of healthy foods. Lately with holidays there have been more sweets, She is looking forward to getting back on track with her diet she was following before surgery and holidays. Reviewed mediterranean diet handout. Educated of types of fats, sources, and how to read labels.      Intervention Plan   Intervention Prescribe, educate and counsel regarding individualized specific dietary modifications aiming towards targeted core components such as weight, hypertension, lipid management, diabetes, heart failure and other comorbidities.;Nutrition handout(s) given to patient.    Expected Outcomes Short Term Goal: Understand basic principles of dietary content, such as calories, fat, sodium, cholesterol and nutrients.;Short Term Goal: A plan has been developed with personal nutrition goals set during dietitian appointment.;Long Term Goal: Adherence to prescribed nutrition plan.          Nutrition Assessments:  MEDIFICTS Score Key: >=70 Need to make dietary changes  40-70 Heart Healthy Diet <= 40 Therapeutic Level Cholesterol Diet  Flowsheet Row Cardiac Rehab from 02/28/2024 in [facility] Cardiac and Pulmonary Rehab  Picture Your Plate Total Score on Admission 77   Picture Your Plate Scores: <59 Unhealthy dietary pattern with much room for improvement. 41-50 Dietary pattern unlikely to meet recommendations for good health and room for improvement. 51-60 More healthful dietary pattern, with some room for improvement.  >60 Healthy dietary pattern, although there may be some specific behaviors that could be improved.    Nutrition Goals Re-Evaluation:  Nutrition Goals Re-Evaluation     Row Name  04/10/24 1622             Goals   Comment Spoke with Jon about nutrition goals and habit building. She  feels she has done well, but with school starting back up soon she knows this will be the important step towards building sustainable habits.       Expected Outcome STG: Follow through on nutrition goals while school starts back up and normal routine is re-establish. LTG: Follow a heart healthy diet          Nutrition Goals Discharge (Final Nutrition Goals Re-Evaluation):  Nutrition Goals Re-Evaluation - 04/10/24 1622       Goals   Comment Spoke with Jon about nutrition goals and habit building. She feels she has done well, but with school starting back up soon she knows this will be the important step towards building sustainable habits.    Expected Outcome STG: Follow through on nutrition goals while school starts back up and normal routine is re-establish. LTG: Follow a heart healthy diet          Psychosocial: Target Goals: Acknowledge presence or absence of significant depression and/or stress, maximize coping skills, provide positive support system. Participant is able to verbalize types and ability to use techniques and skills needed for reducing stress and depression.   Education: Stress, Anxiety, and Depression - Group verbal and visual presentation to define topics covered.  Reviews how body is impacted by stress, anxiety, and depression.  Also discusses healthy ways to reduce stress and to treat/manage anxiety and depression. Written material provided at class time. Flowsheet Row Cardiac Rehab from 04/10/2024 in [facility] Cardiac and Pulmonary Rehab  Date 03/06/24  Educator lc  Instruction Review Code 1- Verbalizes Understanding    Education: Sleep Hygiene -Provides group verbal and written instruction about how sleep can affect your health.  Define sleep hygiene, discuss sleep cycles and impact of sleep habits. Review good sleep hygiene tips.   Initial Review & Psychosocial Screening:  Initial Psych Review & Screening - 02/23/24 0952       Initial Review   Current issues  with None Identified      Family Dynamics   Good Support System? Yes      Barriers   Psychosocial barriers to participate in program There are no identifiable barriers or psychosocial needs.      Screening Interventions   Interventions Encouraged to exercise;Provide feedback about the scores to participant;To provide support and resources with identified psychosocial needs    Expected Outcomes Short Term goal: Utilizing psychosocial counselor, staff and physician to assist with identification of specific Stressors or current issues interfering with healing process. Setting desired goal for each stressor or current issue identified.;Long Term Goal: Stressors or current issues are controlled or eliminated.;Short Term goal: Identification and review with participant of any Quality of Life or Depression concerns found by scoring the questionnaire.;Long Term goal: The participant improves quality of Life and PHQ9 Scores as seen by post scores and/or verbalization of changes          Quality of Life Scores:   Quality of Life - 02/28/24 1547       Quality of Life   Select Quality of Life      Quality of Life Scores   Health/Function Pre 24.43 %    Socioeconomic Pre 30 %    Psych/Spiritual Pre 30 %    Family Pre 28.8 %    GLOBAL Pre 27.38 %  Scores of 19 and below usually indicate a poorer quality of life in these areas.  A difference of  2-3 points is a clinically meaningful difference.  A difference of 2-3 points in the total score of the Quality of Life Index has been associated with significant improvement in overall quality of life, self-image, physical symptoms, and general health in studies assessing change in quality of life.  PHQ-9: Review Flowsheet       02/28/2024  Depression screen PHQ 2/9  Decreased Interest 0  Down, Depressed, Hopeless 0  PHQ - 2 Score 0  Altered sleeping 2  Tired, decreased energy 2  Change in appetite 0  Feeling bad or failure about  yourself  0  Trouble concentrating 0  Moving slowly or fidgety/restless 0  Suicidal thoughts 0  PHQ-9 Score 4  Difficult doing work/chores Somewhat difficult   Interpretation of Total Score  Total Score Depression Severity:  1-4 = Minimal depression, 5-9 = Mild depression, 10-14 = Moderate depression, 15-19 = Moderately severe depression, 20-27 = Severe depression   Psychosocial Evaluation and Intervention:  Psychosocial Evaluation - 02/23/24 1001       Psychosocial Evaluation & Interventions   Interventions Encouraged to exercise with the program and follow exercise prescription    Comments Ms. Trettin is coming to cardiac rehab after a valve replacement. She states she is healing well. She has still some chest discomfort and is learning what is her new normal after surgery. She mentions that her faith helps her get through all things in life and states she has no stress concerns. She wants to come to the program to learn more about heart healthy living. 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 She says her sleep has improved a lot since surgery when she began to struggle with sleep.       Expected Outcomes STG: Continue to attend rehab and focus on good sleep. LTG: Achieve and maintain positive outlook on health and daily life       Interventions Encouraged to attend Cardiac Rehabilitation for the exercise       Continue Psychosocial Services   Follow up required by staff          Psychosocial Discharge (Final Psychosocial Re-Evaluation):  Psychosocial Re-Evaluation - 04/10/24 1620       Psychosocial Re-Evaluation   Current issues with None Identified    Comments Stacy Mercer denies any anxiety, depression, or stress at this time. She says her sleep has improved a lot since surgery when she began to struggle with sleep.    Expected Outcomes STG: Continue to attend rehab and focus on good sleep. LTG: Achieve and maintain positive outlook on health and daily life    Interventions Encouraged to attend Cardiac Rehabilitation for the exercise    Continue Psychosocial Services  Follow up required by staff  Vocational Rehabilitation: Provide vocational rehab assistance to qualifying candidates.   Vocational Rehab Evaluation & Intervention:  Vocational Rehab - 02/23/24 0952       Initial Vocational Rehab Evaluation & Intervention   Assessment shows need for Vocational Rehabilitation No          Education: Education Goals: Education classes will be provided on a variety of topics geared toward better understanding of heart health and risk factor modification. Participant will state understanding/return demonstration of topics presented as noted by education test scores.  Learning Barriers/Preferences:  Learning Barriers/Preferences - 02/23/24 0951       Learning Barriers/Preferences   Learning Barriers None    Learning Preferences Individual Instruction          General Cardiac Education Topics:  AED/CPR: - Group verbal and written instruction with the use of models to demonstrate the basic use of the AED with the basic ABC's of resuscitation.   Test and Procedures: - Group verbal and visual presentation and models provide information about basic cardiac anatomy and function. Reviews the testing methods done to diagnose heart disease and the outcomes of the test results. Describes the treatment choices:  Medical Management, Angioplasty, or Coronary Bypass Surgery for treating various heart conditions including Myocardial Infarction, Angina, Valve Disease, and Cardiac Arrhythmias. Written material provided at class time. Flowsheet Row Cardiac Rehab from 04/10/2024 in [facility] Cardiac and Pulmonary Rehab  Education need identified 02/28/24  Date 04/10/24  Educator lc  Instruction Review Code 1- Verbalizes Understanding    Medication Safety: - Group verbal and visual instruction to review commonly prescribed medications for heart and lung disease. Reviews the medication, class of the drug, and side effects. Includes the steps to properly store meds and maintain the prescription regimen. Written material provided at class time.   Intimacy: - Group verbal instruction through game format to discuss how heart and lung disease can affect sexual intimacy. Written material provided at class time.   Know Your Numbers and Heart Failure: - Group verbal and visual instruction to discuss disease risk factors for cardiac and pulmonary disease and treatment options.  Reviews associated critical values for Overweight/Obesity, Hypertension, Cholesterol, and Diabetes.  Discusses basics of heart failure: signs/symptoms and treatments.  Introduces Heart Failure Zone chart for action plan for heart failure. Written material provided at class time.   Infection Prevention: - Provides verbal and written material to individual with discussion of infection control including proper hand washing and proper equipment cleaning during exercise session. Flowsheet Row Cardiac Rehab from 04/10/2024 in [facility] Cardiac and Pulmonary Rehab  Date 02/28/24  Educator [facility]  Instruction Review Code 1- Verbalizes Understanding    Falls Prevention: - Provides verbal and written material to individual with discussion of falls prevention and safety. Flowsheet Row Cardiac Rehab from 04/10/2024 in [facility] Cardiac and Pulmonary Rehab  Date 02/28/24   Educator [facility]  Instruction Review Code 1- Verbalizes Understanding    Other: -Provides group and verbal instruction on various topics (see comments)   Knowledge Questionnaire Score:  Knowledge Questionnaire Score - 02/28/24 1547       Knowledge Questionnaire Score   Pre Score 23/26          Core Components/Risk Factors/Patient Goals at Admission:  Personal Goals and Risk Factors at Admission - 02/28/24 1546       Core Components/Risk Factors/Patient Goals on Admission    Weight Management Yes;Weight Loss    Intervention Weight Management: Develop a combined nutrition and exercise program designed to reach desired  caloric intake, while maintaining appropriate intake of nutrient and fiber, sodium and fats, and appropriate energy expenditure required for the weight goal.;Weight Management: Provide education and appropriate resources to help participant work on and attain dietary goals.;Weight Management/Obesity: Establish reasonable short term and long term weight goals.    Admit Weight 178 lb 3.2 oz (80.8 kg)    Goal Weight: Short Term 170 lb (77.1 kg)    Goal Weight: Long Term 150 lb (68 kg)    Expected Outcomes Short Term: Continue to assess and modify interventions until short term weight is achieved;Long Term: Adherence to nutrition and physical activity/exercise program aimed toward attainment of established weight goal;Weight Loss: Understanding of general recommendations for a balanced deficit meal plan, which promotes 1-2 lb weight loss per week and includes a negative energy balance of [phone] kcal/d;Understanding recommendations for meals to include 15-35% energy as protein, 25-35% energy from fat, 35-60% energy from carbohydrates, less than 200mg  of dietary cholesterol, 20-35 gm of total fiber daily;Understanding of distribution of calorie intake throughout the day with the consumption of 4-5 meals/snacks    Hypertension Yes    Intervention Provide education on lifestyle  modifcations including regular physical activity/exercise, weight management, moderate sodium restriction and increased consumption of fresh fruit, vegetables, and low fat dairy, alcohol moderation, and smoking cessation.;Monitor prescription use compliance.    Expected Outcomes Long Term: Maintenance of blood pressure at goal levels.;Short Term: Continued assessment and intervention until BP is < 140/19mm HG in hypertensive participants. < 130/69mm HG in hypertensive participants with diabetes, heart failure or chronic kidney disease.          Education:Diabetes - Individual verbal and written instruction to review signs/symptoms of diabetes, desired ranges of glucose level fasting, after meals and with exercise. Acknowledge that pre and post exercise glucose checks will be done for 3 sessions at entry of program.   Core Components/Risk Factors/Patient Goals Review:   Goals and Risk Factor Review     Row Name 04/10/24 1624             Core Components/Risk Factors/Patient Goals Review   Personal Goals Review Weight Management/Obesity;Hypertension       Review Stacy Mercer reports she has not been checking her BP at home much but says she should get back in that habit. Encouraged her to check it at home and not to forget about it, make it a habit. She is working on losing a few pounds since the holidays are over. Spoke with RD about healthy eating       Expected Outcomes STG: Lose holiday weight and start checking BP at home. LTG: manage risk factors independently          Core Components/Risk Factors/Patient Goals at Discharge (Final Review):   Goals and Risk Factor Review - 04/10/24 1624       Core Components/Risk Factors/Patient Goals Review   Personal Goals Review Weight Management/Obesity;Hypertension    Review Stacy Mercer reports she has not been checking her BP at home much but says she should get back in that habit. Encouraged her to check it at home and not to forget about it, make it a  habit. She is working on losing a few pounds since the holidays are over. Spoke with RD about healthy eating    Expected Outcomes STG: Lose holiday weight and start checking BP at home. LTG: manage risk factors independently          ITP Comments:  ITP Comments     Row Name  02/23/24 1001 02/28/24 1534 03/04/24 1556 03/20/24 0920 04/17/24 1054   ITP Comments Initial phone call completed. Diagnosis can be found in [facility] 11/17. EP Orientation scheduled for Wednesday 11/26 at 1:30. Completed and gym orientation for cardiac rehab. Initial ITP created and sent for review to Dr. Oneil Pinal, Medical Director. First full day of exercise!  Patient was oriented to gym and equipment including functions, settings, policies, and procedures.  Patient's individual exercise prescription and treatment plan were reviewed.  All starting workloads were established based on the results of the 6 minute walk test done at initial orientation visit.  The plan for exercise progression was also introduced and progression will be customized based on patient's performance and goals. 30 Day review completed. Medical Director ITP review done, changes made as directed, and signed approval by Medical Director. New to program. 30 Day review completed. Medical Director ITP review done, changes made as directed, and signed approval by Medical Director.      Comments: 30 Day Review     [1]  Current Outpatient Medications:    amiodarone (PACERONE) 200 MG tablet, Take 200 mg by mouth., Disp: , Rfl:    aspirin  81 MG chewable tablet, Chew 81 mg by mouth., Disp: , Rfl:    ivermectin (STROMECTOL) 3 MG TABS tablet, Take 150 mcg/kg by mouth once., Disp: , Rfl:    levonorgestrel (MIRENA) 20 MCG/24HR IUD, 1 each by Intrauterine route once., Disp: , Rfl:    losartan (COZAAR) 25 MG tablet, Take 25 mg by mouth daily., Disp: , Rfl:    metoprolol succinate (TOPROL-XL) 25 MG 24 hr tablet, Take 25 mg by mouth daily. (Patient not taking:  Reported on 02/23/2024), Disp: , Rfl:    metoprolol tartrate (LOPRESSOR) 50 MG tablet, Take 50 mg by mouth., Disp: , Rfl:    Multiple Vitamin (MULTI-VITAMIN) tablet, Take 1 tablet by mouth daily., Disp: , Rfl:    warfarin (COUMADIN) 2 MG tablet, Take 5 mg by mouth., Disp: , Rfl:  [2]  Social History Tobacco Use  Smoking Status Never  Smokeless Tobacco Never

## 2024-04-17 NOTE — Progress Notes (Signed)
 Daily Session Note  Patient Details  Name: Stacy Mercer MRN: [id number] Date of Birth: 1971-02-04 Referring Provider:   Flowsheet Row Cardiac Rehab from 02/28/2024 in [facility] Cardiac and Pulmonary Rehab  Referring Provider Paraschos    Encounter Date: 04/17/2024  Check In:  Session Check In - 04/17/24 1557       Check-In   Supervising physician immediately available to respond to emergencies See telemetry face sheet for immediately available ER MD    Location [facility]-Cardiac & Pulmonary Rehab    Staff Present Burnard Davenport RN,BSN,MPA;Joseph Rolinda RCP,RRT,BSRT;Meredith Tressa RN,BSN;Litzy Dicker Dyane BS, ACSM CEP, Exercise Physiologist    Virtual Visit No    Medication changes reported     No    Fall or balance concerns reported    No    Warm-up and Cool-down Performed on first and last piece of equipment    Resistance Training Performed Yes    VAD Patient? No    PAD/SET Patient? No      Pain Assessment   Currently in Pain? No/denies             Tobacco Use History[1]  Goals Met:  Independence with exercise equipment Exercise tolerated well No report of concerns or symptoms today Strength training completed today  Goals Unmet:  Not Applicable  Comments: Pt able to follow exercise prescription today without complaint.  Will continue to monitor for progression.    Dr. Oneil Pinal is Medical Director for [facility] Cardiac Rehabilitation.  Dr. Fuad Aleskerov is Medical Director for [facility] Pulmonary Rehabilitation.    [1]  Social History Tobacco Use  Smoking Status Never  Smokeless Tobacco Never

## 2024-04-18 ENCOUNTER — Encounter

## 2024-04-22 ENCOUNTER — Encounter

## 2024-04-24 ENCOUNTER — Encounter

## 2024-04-24 DIAGNOSIS — Z952 Presence of prosthetic heart valve: Secondary | ICD-10-CM

## 2024-04-24 NOTE — Progress Notes (Signed)
 Daily Session Note  Patient Details  Name: BEXLEE BERGDOLL MRN: [id number] Date of Birth: 1970/06/19 Referring Provider:   Flowsheet Row Cardiac Rehab from 02/28/2024 in [facility] Cardiac and Pulmonary Rehab  Referring Provider Paraschos    Encounter Date: 04/24/2024  Check In:  Session Check In - 04/24/24 1548       Check-In   Supervising physician immediately available to respond to emergencies See telemetry face sheet for immediately available ER MD    Location [facility]-Cardiac & Pulmonary Rehab    Staff Present Burnard Davenport RN,BSN,MPA;Meredith Tressa RN,BSN;Joseph Rolinda RCP,RRT,BSRT;Jonquil Stubbe Dyane BS, ACSM CEP, Exercise Physiologist    Virtual Visit No    Medication changes reported     No    Fall or balance concerns reported    No    Warm-up and Cool-down Performed on first and last piece of equipment    Resistance Training Performed Yes    VAD Patient? No    PAD/SET Patient? No      Pain Assessment   Currently in Pain? No/denies             Tobacco Use History[1]  Goals Met:  Independence with exercise equipment Exercise tolerated well No report of concerns or symptoms today Strength training completed today  Goals Unmet:  Not Applicable  Comments: Pt able to follow exercise prescription today without complaint.  Will continue to monitor for progression.    Dr. Oneil Pinal is Medical Director for [facility] Cardiac Rehabilitation.  Dr. Fuad Aleskerov is Medical Director for [facility] Pulmonary Rehabilitation.    [1]  Social History Tobacco Use  Smoking Status Never  Smokeless Tobacco Never

## 2024-04-25 ENCOUNTER — Encounter: Admitting: *Deleted

## 2024-04-25 DIAGNOSIS — Z952 Presence of prosthetic heart valve: Secondary | ICD-10-CM

## 2024-04-25 NOTE — Progress Notes (Signed)
 Daily Session Note  Patient Details  Name: Stacy Mercer MRN: [id number] Date of Birth: 10/23/1970 Referring Provider:   Flowsheet Row Cardiac Rehab from 02/28/2024 in [facility] Cardiac and Pulmonary Rehab  Referring Provider Paraschos    Encounter Date: 04/25/2024  Check In:  Session Check In - 04/25/24 1547       Check-In   Supervising physician immediately available to respond to emergencies See telemetry face sheet for immediately available ER MD    Location [facility]-Cardiac & Pulmonary Rehab    Staff Present Hoy Rodney RN,BSN;[facility] RCP,RRT,BSRT;Margaret Best, MS, Exercise Physiologist;Noah Tickle, BS, Exercise Physiologist    Virtual Visit No    Medication changes reported     No    Fall or balance concerns reported    No    Warm-up and Cool-down Performed on first and last piece of equipment    Resistance Training Performed Yes    VAD Patient? No    PAD/SET Patient? No      Pain Assessment   Currently in Pain? No/denies             Tobacco Use History[1]  Goals Met:  Independence with exercise equipment Exercise tolerated well No report of concerns or symptoms today Strength training completed today  Goals Unmet:  Not Applicable  Comments: Pt able to follow exercise prescription today without complaint.  Will continue to monitor for progression.    Dr. Oneil Pinal is Medical Director for [facility] Cardiac Rehabilitation.  Dr. Fuad Aleskerov is Medical Director for [facility] Pulmonary Rehabilitation.    [1]  Social History Tobacco Use  Smoking Status Never  Smokeless Tobacco Never

## 2024-04-29 ENCOUNTER — Encounter

## 2024-05-01 ENCOUNTER — Encounter: Admitting: Emergency Medicine

## 2024-05-01 DIAGNOSIS — Z952 Presence of prosthetic heart valve: Secondary | ICD-10-CM

## 2024-05-01 NOTE — Progress Notes (Signed)
 Daily Session Note  Patient Details  Name: Stacy Mercer MRN: [id number] Date of Birth: [birth year] Referring Provider:   Flowsheet Row Cardiac Rehab from 02/28/2024 in [facility] Cardiac and Pulmonary Rehab  Referring Provider Paraschos    Encounter Date: 05/01/2024  Check In:  Session Check In - 05/01/24 1544       Check-In   Supervising physician immediately available to respond to emergencies See telemetry face sheet for immediately available ER MD    Location [facility]-Cardiac & Pulmonary Rehab    Staff Present Leita Franks RN,BSN;Meredith Tressa RN,BSN;Kelly Bollinger [facility] Dyane BS, ACSM CEP, Exercise Physiologist    Virtual Visit No    Medication changes reported     No    Fall or balance concerns reported    No    Warm-up and Cool-down Performed on first and last piece of equipment    Resistance Training Performed Yes    VAD Patient? No    PAD/SET Patient? No      Pain Assessment   Currently in Pain? No/denies             Tobacco Use History[1]  Goals Met:  Independence with exercise equipment Exercise tolerated well No report of concerns or symptoms today Strength training completed today  Goals Unmet:  Not Applicable  Comments: Pt able to follow exercise prescription today without complaint.  Will continue to monitor for progression.    Dr. Oneil Pinal is Medical Director for [facility] Cardiac Rehabilitation.  Dr. Fuad Aleskerov is Medical Director for [facility] Pulmonary Rehabilitation.    [1]  Social History Tobacco Use  Smoking Status Never  Smokeless Tobacco Never

## 2024-05-02 ENCOUNTER — Encounter: Admitting: *Deleted

## 2024-05-02 DIAGNOSIS — Z952 Presence of prosthetic heart valve: Secondary | ICD-10-CM

## 2024-05-02 NOTE — Progress Notes (Signed)
 Daily Session Note  Patient Details  Name: Stacy Mercer MRN: [id number] Date of Birth: 01/26/1971 Referring Provider:   Flowsheet Row Cardiac Rehab from 02/28/2024 in [facility] Cardiac and Pulmonary Rehab  Referring Provider Paraschos    Encounter Date: 05/02/2024  Check In:  Session Check In - 05/02/24 1531       Check-In   Supervising physician immediately available to respond to emergencies See telemetry face sheet for immediately available ER MD    Location [facility]-Cardiac & Pulmonary Rehab    Staff Present Hoy Rodney RN,BSN;Joseph [facility] BS, Exercise Physiologist;Noah Tickle, BS, Exercise Physiologist    Virtual Visit No    Medication changes reported     No    Fall or balance concerns reported    No    Warm-up and Cool-down Performed on first and last piece of equipment    Resistance Training Performed Yes    VAD Patient? No    PAD/SET Patient? No      Pain Assessment   Currently in Pain? No/denies             Tobacco Use History[1]  Goals Met:  Independence with exercise equipment Exercise tolerated well No report of concerns or symptoms today Strength training completed today  Goals Unmet:  Not Applicable  Comments: Pt able to follow exercise prescription today without complaint.  Will continue to monitor for progression.    Dr. Oneil Pinal is Medical Director for [facility] Cardiac Rehabilitation.  Dr. Fuad Aleskerov is Medical Director for [facility] Pulmonary Rehabilitation.    [1]  Social History Tobacco Use  Smoking Status Never  Smokeless Tobacco Never

## 2024-05-06 ENCOUNTER — Encounter

## 2024-05-08 ENCOUNTER — Encounter

## 2024-05-08 DIAGNOSIS — Z952 Presence of prosthetic heart valve: Secondary | ICD-10-CM

## 2024-05-08 NOTE — Progress Notes (Signed)
 Daily Session Note  Patient Details  Name: Stacy Mercer MRN: [id number] Date of Birth: [birth year] Referring Provider:   Flowsheet Row Cardiac Rehab from 02/28/2024 in [facility] Cardiac and Pulmonary Rehab  Referring Provider Paraschos    Encounter Date: 05/08/2024  Check In:  Session Check In - 05/08/24 1535       Check-In   Supervising physician immediately available to respond to emergencies See telemetry face sheet for immediately available ER MD    Location [facility]-Cardiac & Pulmonary Rehab    Staff Present Burnard Davenport RN,BSN,MPA;Meredith Tressa RN,BSN;Joseph Rolinda RCP,RRT,BSRT;Joanie Duprey Dyane BS, ACSM CEP, Exercise Physiologist    Virtual Visit No    Medication changes reported     No    Fall or balance concerns reported    No    Warm-up and Cool-down Performed on first and last piece of equipment    Resistance Training Performed Yes    VAD Patient? No    PAD/SET Patient? No      Pain Assessment   Currently in Pain? No/denies             Tobacco Use History[1]  Goals Met:  Independence with exercise equipment Exercise tolerated well No report of concerns or symptoms today Strength training completed today  Goals Unmet:  Not Applicable  Comments: Pt able to follow exercise prescription today without complaint.  Will continue to monitor for progression.    Dr. Oneil Pinal is Medical Director for [facility] Cardiac Rehabilitation.  Dr. Fuad Aleskerov is Medical Director for [facility] Pulmonary Rehabilitation.    [1]  Social History Tobacco Use  Smoking Status Never  Smokeless Tobacco Never

## 2024-05-09 ENCOUNTER — Encounter: Admitting: *Deleted

## 2024-05-09 DIAGNOSIS — Z952 Presence of prosthetic heart valve: Secondary | ICD-10-CM

## 2024-05-09 NOTE — Progress Notes (Signed)
 Daily Session Note  Patient Details  Name: Stacy Mercer MRN: [id number] Date of Birth: 05/02/1970 Referring Provider:   Flowsheet Row Cardiac Rehab from 02/28/2024 in [facility] Cardiac and Pulmonary Rehab  Referring Provider Paraschos    Encounter Date: 05/09/2024  Check In:  Session Check In - 05/09/24 1539       Check-In   Supervising physician immediately available to respond to emergencies See telemetry face sheet for immediately available ER MD    Location [facility]-Cardiac & Pulmonary Rehab    Staff Present Hoy Rodney RN,BSN;Joseph [facility] RN,BSN;Noah Tickle, [place], Exercise Physiologist    Virtual Visit No    Medication changes reported     No    Fall or balance concerns reported    No    Warm-up and Cool-down Performed on first and last piece of equipment    Resistance Training Performed Yes    VAD Patient? No    PAD/SET Patient? No      Pain Assessment   Currently in Pain? No/denies             Tobacco Use History[1]  Goals Met:  Independence with exercise equipment Exercise tolerated well No report of concerns or symptoms today Strength training completed today  Goals Unmet:  Not Applicable  Comments: Pt able to follow exercise prescription today without complaint.  Will continue to monitor for progression.    Dr. Oneil Pinal is Medical Director for [facility] Cardiac Rehabilitation.  Dr. Fuad Aleskerov is Medical Director for [facility] Pulmonary Rehabilitation.    [1]  Social History Tobacco Use  Smoking Status Never  Smokeless Tobacco Never

## 2024-05-13 ENCOUNTER — Encounter

## 2024-05-15 ENCOUNTER — Encounter

## 2024-05-16 ENCOUNTER — Encounter

## 2024-05-20 ENCOUNTER — Encounter

## 2024-05-22 ENCOUNTER — Encounter

## 2024-05-23 ENCOUNTER — Encounter

## 2024-05-27 ENCOUNTER — Encounter

## 2024-05-29 ENCOUNTER — Encounter

## 2024-05-30 ENCOUNTER — Encounter
